# Patient Record
Sex: Female | Born: 1985 | Race: Black or African American | Hispanic: No | Marital: Single | Smoking: Never smoker
Health system: Southern US, Community
[De-identification: ages and names within clinical notes are randomized; demographics above are authoritative.]

## PROBLEM LIST (undated history)

## (undated) DIAGNOSIS — J45909 Unspecified asthma, uncomplicated: Secondary | ICD-10-CM

## (undated) HISTORY — PX: TUBAL LIGATION: SHX77

---

## 2008-05-28 ENCOUNTER — Emergency Department: Payer: Self-pay | Admitting: Emergency Medicine

## 2009-04-03 ENCOUNTER — Emergency Department: Payer: Self-pay | Admitting: Emergency Medicine

## 2010-11-08 ENCOUNTER — Emergency Department: Payer: Self-pay | Admitting: Emergency Medicine

## 2011-11-02 ENCOUNTER — Emergency Department: Payer: Self-pay | Admitting: Emergency Medicine

## 2011-11-02 LAB — PREGNANCY, URINE: Pregnancy Test, Urine: NEGATIVE m[IU]/mL

## 2011-11-02 LAB — URINALYSIS, COMPLETE
Glucose,UR: NEGATIVE mg/dL (ref 0–75)
Ketone: NEGATIVE
Leukocyte Esterase: NEGATIVE
Nitrite: NEGATIVE
Protein: NEGATIVE
RBC,UR: 1 /HPF (ref 0–5)
Specific Gravity: 1.021 (ref 1.003–1.030)
WBC UR: 2 /HPF (ref 0–5)

## 2011-11-02 LAB — CBC WITH DIFFERENTIAL/PLATELET
Basophil #: 0 10*3/uL (ref 0.0–0.1)
Eosinophil #: 0.3 10*3/uL (ref 0.0–0.7)
HCT: 33.5 % — ABNORMAL LOW (ref 35.0–47.0)
HGB: 10.7 g/dL — ABNORMAL LOW (ref 12.0–16.0)
Lymphocyte #: 2.1 10*3/uL (ref 1.0–3.6)
MCHC: 31.9 g/dL — ABNORMAL LOW (ref 32.0–36.0)
Monocyte #: 0.5 x10 3/mm (ref 0.2–0.9)
Platelet: 237 10*3/uL (ref 150–440)
RDW: 15.1 % — ABNORMAL HIGH (ref 11.5–14.5)

## 2011-11-02 LAB — COMPREHENSIVE METABOLIC PANEL
Anion Gap: 8 (ref 7–16)
BUN: 13 mg/dL (ref 7–18)
Bilirubin,Total: 0.3 mg/dL (ref 0.2–1.0)
Chloride: 102 mmol/L (ref 98–107)
Co2: 28 mmol/L (ref 21–32)
Creatinine: 0.66 mg/dL (ref 0.60–1.30)
EGFR (African American): >60
EGFR (Non-African Amer.): >60
Potassium: 4.1 mmol/L (ref 3.5–5.1)
SGOT(AST): 9 U/L — ABNORMAL LOW (ref 15–37)
Total Protein: 8.3 g/dL — ABNORMAL HIGH (ref 6.4–8.2)

## 2012-08-24 ENCOUNTER — Emergency Department: Payer: Self-pay | Admitting: Emergency Medicine

## 2012-08-24 LAB — URINALYSIS, COMPLETE
Glucose,UR: NEGATIVE mg/dL (ref 0–75)
Ph: 6 (ref 4.5–8.0)
RBC,UR: 1 /HPF (ref 0–5)
Specific Gravity: 1.017 (ref 1.003–1.030)
Squamous Epithelial: 4
WBC UR: 3 /HPF (ref 0–5)

## 2012-08-24 LAB — COMPREHENSIVE METABOLIC PANEL
Albumin: 3.1 g/dL — ABNORMAL LOW (ref 3.4–5.0)
Anion Gap: 8 (ref 7–16)
Calcium, Total: 8.9 mg/dL (ref 8.5–10.1)
Chloride: 102 mmol/L (ref 98–107)
Co2: 26 mmol/L (ref 21–32)
Creatinine: 0.74 mg/dL (ref 0.60–1.30)
EGFR (African American): >60
Potassium: 3.9 mmol/L (ref 3.5–5.1)
SGOT(AST): 14 U/L — ABNORMAL LOW (ref 15–37)
SGPT (ALT): 15 U/L (ref 12–78)
Sodium: 136 mmol/L (ref 136–145)

## 2012-08-24 LAB — LIPASE, BLOOD: Lipase: 82 U/L (ref 73–393)

## 2012-08-24 LAB — CBC
HCT: 31.5 % — ABNORMAL LOW (ref 35.0–47.0)
HGB: 9.8 g/dL — ABNORMAL LOW (ref 12.0–16.0)
MCHC: 31.2 g/dL — ABNORMAL LOW (ref 32.0–36.0)
MCV: 75 fL — ABNORMAL LOW (ref 80–100)
Platelet: 197 10*3/uL (ref 150–440)
RDW: 17.6 % — ABNORMAL HIGH (ref 11.5–14.5)
WBC: 7.9 10*3/uL (ref 3.6–11.0)

## 2012-08-24 LAB — WET PREP, GENITAL

## 2012-08-25 LAB — GC/CHLAMYDIA PROBE AMP

## 2015-03-24 ENCOUNTER — Emergency Department
Admission: EM | Admit: 2015-03-24 | Discharge: 2015-03-24 | Disposition: A | Discharge status: Home / Self Care | Payer: Self-pay | Attending: Emergency Medicine | Admitting: Emergency Medicine

## 2015-03-24 ENCOUNTER — Encounter: Payer: Self-pay | Admitting: Emergency Medicine

## 2015-03-24 ENCOUNTER — Emergency Department: Payer: Self-pay

## 2015-03-24 DIAGNOSIS — Z3202 Encounter for pregnancy test, result negative: Secondary | ICD-10-CM | POA: Insufficient documentation

## 2015-03-24 DIAGNOSIS — N390 Urinary tract infection, site not specified: Secondary | ICD-10-CM | POA: Insufficient documentation

## 2015-03-24 DIAGNOSIS — R112 Nausea with vomiting, unspecified: Secondary | ICD-10-CM | POA: Insufficient documentation

## 2015-03-24 HISTORY — DX: Unspecified asthma, uncomplicated: J45.909

## 2015-03-24 LAB — COMPREHENSIVE METABOLIC PANEL
ALT: 12 U/L — ABNORMAL LOW (ref 14–54)
AST: 16 U/L (ref 15–41)
Albumin: 3.9 g/dL (ref 3.5–5.0)
Alkaline Phosphatase: 56 U/L (ref 38–126)
Anion gap: 8 (ref 5–15)
BILIRUBIN TOTAL: 0.6 mg/dL (ref 0.3–1.2)
BUN: 10 mg/dL (ref 6–20)
CHLORIDE: 102 mmol/L (ref 101–111)
CO2: 28 mmol/L (ref 22–32)
Calcium: 9.1 mg/dL (ref 8.9–10.3)
Creatinine, Ser: 0.74 mg/dL (ref 0.44–1.00)
GFR calc non Af Amer: >60 mL/min (ref >60)
Glucose, Bld: 109 mg/dL — ABNORMAL HIGH (ref 65–99)
POTASSIUM: 4.3 mmol/L (ref 3.5–5.1)
Sodium: 138 mmol/L (ref 135–145)
TOTAL PROTEIN: 8.3 g/dL — AB (ref 6.5–8.1)

## 2015-03-24 LAB — URINALYSIS COMPLETE WITH MICROSCOPIC (ARMC ONLY)
BACTERIA UA: NONE SEEN
BILIRUBIN URINE: NEGATIVE
Glucose, UA: NEGATIVE mg/dL
KETONES UR: NEGATIVE mg/dL
NITRITE: NEGATIVE
PH: 5 (ref 5.0–8.0)
PROTEIN: NEGATIVE mg/dL
Specific Gravity, Urine: 1.016 (ref 1.005–1.030)

## 2015-03-24 LAB — CBC
HEMATOCRIT: 35.9 % (ref 35.0–47.0)
Hemoglobin: 11.4 g/dL — ABNORMAL LOW (ref 12.0–16.0)
MCH: 24.8 pg — ABNORMAL LOW (ref 26.0–34.0)
MCHC: 31.8 g/dL — ABNORMAL LOW (ref 32.0–36.0)
MCV: 77.9 fL — AB (ref 80.0–100.0)
PLATELETS: 205 10*3/uL (ref 150–440)
RBC: 4.61 MIL/uL (ref 3.80–5.20)
RDW: 16.6 % — AB (ref 11.5–14.5)
WBC: 8.1 10*3/uL (ref 3.6–11.0)

## 2015-03-24 LAB — PREGNANCY, URINE: Preg Test, Ur: NEGATIVE

## 2015-03-24 LAB — LIPASE, BLOOD: LIPASE: 22 U/L (ref 11–51)

## 2015-03-24 MED ORDER — MORPHINE SULFATE (PF) 4 MG/ML IV SOLN
4.0000 mg | Freq: Once | INTRAVENOUS | Status: AC
  Administered 2015-03-24: 4 mg via INTRAVENOUS
  Filled 2015-03-24: qty 1

## 2015-03-24 MED ORDER — IOHEXOL 240 MG/ML SOLN
25.0000 mL | Freq: Once | INTRAMUSCULAR | Status: AC | PRN
  Administered 2015-03-24: 25 mL via ORAL

## 2015-03-24 MED ORDER — SODIUM CHLORIDE 0.9 % IV BOLUS (SEPSIS)
1000.0000 mL | Freq: Once | INTRAVENOUS | Status: AC
  Administered 2015-03-24: 1000 mL via INTRAVENOUS

## 2015-03-24 MED ORDER — ONDANSETRON 4 MG PO TBDP: 4.0000 mg | ORAL_TABLET | Freq: Four times a day (QID) | ORAL | Status: DC | PRN

## 2015-03-24 MED ORDER — CEPHALEXIN 500 MG PO CAPS: 500.0000 mg | ORAL_CAPSULE | Freq: Four times a day (QID) | ORAL | Status: DC

## 2015-03-24 MED ORDER — ONDANSETRON HCL 4 MG/2ML IJ SOLN
4.0000 mg | Freq: Once | INTRAMUSCULAR | Status: AC
  Administered 2015-03-24: 4 mg via INTRAVENOUS
  Filled 2015-03-24: qty 2

## 2015-03-24 MED ORDER — IOHEXOL 350 MG/ML SOLN
115.0000 mL | Freq: Once | INTRAVENOUS | Status: AC | PRN
  Administered 2015-03-24: 115 mL via INTRAVENOUS

## 2015-03-24 NOTE — ED Provider Notes (Signed)
[facility] Emergency Department Provider Note  ____________________________________________  Time seen: Approximately 9:38 AM  I have reviewed the triage vital signs and the nursing notes.   HISTORY  Chief Complaint Abdominal Pain    HPI Deborah Chambers is a 30 y.o. female presents for evaluation of nausea and vomiting for about the last 3 days. Patient reports on Wednesday she started to feel some nausea and generalized fatigue. Then yesterday she felt nauseous throughout the workday, began having loose brown stool couple of times. Since last evening she has developed a crampy discomfort feels like her "bowels" especially when she sees bathroom. She has had to use the bathroom 2-3 times during the evening to defecate. She denies dysuria reports intermittent but somewhat severe crampy abdominal pain off-and-on throughout this morning. She did vomit once yesterday. No black or bloody vomit. Denies pregnancy, previous tubal ligation. No vaginal discharge, bleeding, or "pelvic" pain.   Past Medical History  Diagnosis Date  . Asthma     There are no active problems to display for this patient.   Past Surgical History  Procedure Laterality Date  . Tubal ligation      Current Outpatient Rx  Name  Route  Sig  Dispense  Refill  . cephALEXin (KEFLEX) 500 MG capsule   Oral   Take 1 capsule (500 mg total) by mouth 4 (four) times daily.   40 capsule   0   . ondansetron (ZOFRAN ODT) 4 MG disintegrating tablet   Oral   Take 1 tablet (4 mg total) by mouth every 6 (six) hours as needed for nausea or vomiting.   20 tablet   0     Allergies Review of patient's allergies indicates no known allergies.  History reviewed. No pertinent family history.  Social History Social History  Substance Use Topics  . Smoking status: Never Smoker   . Smokeless tobacco: None  . Alcohol Use: None    Review of Systems Constitutional: No fever/chills Eyes: No visual  changes. ENT: No sore throat. Cardiovascular: Denies chest pain. Respiratory: Denies shortness of breath. Gastrointestinal: No constipation. Genitourinary: Negative for dysuria. Musculoskeletal: Negative for back pain. Skin: Negative for rash. Neurological: Negative for headaches, focal weakness or numbness.  10-point ROS otherwise negative.  ____________________________________________   PHYSICAL EXAM:  VITAL SIGNS: ED Triage Vitals  Enc Vitals Group     BP 03/24/15 0746 112/53 mmHg     Pulse Rate 03/24/15 0746 91     Resp 03/24/15 0746 20     Temp 03/24/15 0746 97.5 F (36.4 C)     Temp Source 03/24/15 0746 Oral     SpO2 03/24/15 0746 97 %     Weight 03/24/15 0746 380 lb (172.367 kg)     Height 03/24/15 0746 5\' 2"  (1.575 m)     Head Cir --      Peak Flow --      Pain Score 03/24/15 0743 10     Pain Loc --      Pain Edu? --      Excl. in GC? --    Constitutional: Alert and oriented. Well appearing and in no acute distress. Eyes: Conjunctivae are normal. PERRL. EOMI. Head: Atraumatic. Nose: No congestion/rhinnorhea. Mouth/Throat: Mucous membranes are moist.  Oropharynx non-erythematous. Neck: No stridor.   Cardiovascular: Normal rate, regular rhythm. Grossly normal heart sounds.  Good peripheral circulation. Respiratory: Normal respiratory effort.  No retractions. Lungs CTAB. Gastrointestinal: Soft and nontender except for some mild discomfort along the  left lower quadrant without rebound or guarding. No distention. No abdominal bruits. No CVA tenderness. Musculoskeletal: No lower extremity tenderness nor edema.  No joint effusions. Neurologic:  Normal speech and language. No gross focal neurologic deficits are appreciated. Skin:  Skin is warm, dry and intact. No rash noted. Psychiatric: Mood and affect are normal. Speech and behavior are normal.  ____________________________________________   LABS (all labs ordered are listed, but only abnormal results are  displayed)  Labs Reviewed  COMPREHENSIVE METABOLIC PANEL - Abnormal; Notable for the following:    Glucose, Bld 109 (*)    Total Protein 8.3 (*)    ALT 12 (*)    All other components within normal limits  CBC - Abnormal; Notable for the following:    Hemoglobin 11.4 (*)    MCV 77.9 (*)    MCH 24.8 (*)    MCHC 31.8 (*)    RDW 16.6 (*)    All other components within normal limits  URINALYSIS COMPLETEWITH MICROSCOPIC ([facility] ONLY) - Abnormal; Notable for the following:    Color, Urine YELLOW (*)    APPearance HAZY (*)    Hgb urine dipstick 2+ (*)    Leukocytes, UA 1+ (*)    Squamous Epithelial / LPF 6-30 (*)    All other components within normal limits  LIPASE, BLOOD  PREGNANCY, URINE   ____________________________________________  EKG   ____________________________________________  RADIOLOGY   IMPRESSION: 1. Multiple gallstones fill the gallbladder and measure up to 1.8 cm in diameter. No definite pericholecystic fluid or biliary dilatation observed. 2. 8 mm right kidney upper pole fatty density of possibly from scarring or a small angiomyolipoma. 3. Somewhat elongated uterus, 14.1 cm in length, but without a focal abnormality observed. 4. Small umbilical hernia contains adipose tissue. 5. No specific abnormality of the bowel is identified. Appendix normal.   Electronically Signed By: Gaylyn Rong M.D. On: 03/24/2015 11:49       ____________________________________________   PROCEDURES  Procedure(s) performed: None  Critical Care performed: No  ____________________________________________   INITIAL IMPRESSION / ASSESSMENT AND PLAN / ED COURSE  Pertinent labs & imaging results that were available during my care of the patient were reviewed by me and considered in my medical decision making (see chart for details).  Patient transfer evaluation of nausea, crampy abdominal pain with associated 1 episode of vomiting and a few loose stools.  She denies any travel, recent antibiotics, or hospitalization.  Does not seem to have any significant risk factors for Clostridium difficile or infectious colitis. She denies any gynecologic symptoms.  The patient's body habitus does seem to prohibit close examination for peritonitis. Discussed risks and benefits of CT scan with the patient, and after discussion we will obtain CT to further evaluate for signs or symptoms to explain her abdominal discomfort such as diverticulitis, colitis, appendicitis etc. Overall nontoxic with reassuring exam and laboratory analysis.  ----------------------------------------- 2:12 PM on 03/24/2015 -----------------------------------------  Patient reports feeling well. No further nausea or emesis. The abdominal exam reassuring soft nontender nondistended. Discussed possible urinary tract infection and we'll treat. Careful abdominal pain return precautions discussed with patient who is quite agreeable.  Patient is aware that she has gallstones, currently not having any pain in the upper abdomen back or right upper quadrant.Doubt this is acute cholecystitis. Afebrile, normal white count, normal transaminases. No pain in the right upper abdomen and no secondary evidence of cholecystitis by CT.  Return precautions and treatment recommendations and follow-up discussed with the patient who is agreeable  with the plan.  Patient appears much improved. No further emesis diarrhea or concerns this time. ____________________________________________   FINAL CLINICAL IMPRESSION(S) / ED DIAGNOSES  Final diagnoses:  Acute urinary tract infection  Non-intractable vomiting with nausea, vomiting of unspecified type      Sharyn Creamer, MD 03/24/15 1413

## 2015-03-24 NOTE — ED Notes (Signed)
Reports abd pain last pm, n/v/d.

## 2015-03-24 NOTE — Discharge Instructions (Signed)
You were seen in the emergency room for abdominal pain. It is important that you follow up closely with your primary care doctor in the next couple of days.  If you're unable to see her primary care doctor you may return to the emergency room or go to the [facility] walk-in clinic in 1 or 2 days for reexam.  Please return to the emergency room right away if you are to develop a fever, severe nausea, your pain becomes severe or worsens, you are unable to keep food down, begin vomiting any dark or bloody fluid, you develop any dark or bloody stools, feel dehydrated, or other new concerns or symptoms arise.   Urinary Tract Infection Urinary tract infections (UTIs) can develop anywhere along your urinary tract. Your urinary tract is your body's drainage system for removing wastes and extra water. Your urinary tract includes two kidneys, two ureters, a bladder, and a urethra. Your kidneys are a pair of bean-shaped organs. Each kidney is about the size of your fist. They are located below your ribs, one on each side of your spine. CAUSES Infections are caused by microbes, which are microscopic organisms, including fungi, viruses, and bacteria. These organisms are so small that they can only be seen through a microscope. Bacteria are the microbes that most commonly cause UTIs. SYMPTOMS  Symptoms of UTIs may vary by age and gender of the patient and by the location of the infection. Symptoms in young women typically include a frequent and intense urge to urinate and a painful, burning feeling in the bladder or urethra during urination. Older women and men are more likely to be tired, shaky, and weak and have muscle aches and abdominal pain. A fever may mean the infection is in your kidneys. Other symptoms of a kidney infection include pain in your back or sides below the ribs, nausea, and vomiting. DIAGNOSIS To diagnose a UTI, your caregiver will ask you about your symptoms. Your caregiver will also ask you to  provide a urine sample. The urine sample will be tested for bacteria and white blood cells. White blood cells are made by your body to help fight infection. TREATMENT  Typically, UTIs can be treated with medication. Because most UTIs are caused by a bacterial infection, they usually can be treated with the use of antibiotics. The choice of antibiotic and length of treatment depend on your symptoms and the type of bacteria causing your infection. HOME CARE INSTRUCTIONS  If you were prescribed antibiotics, take them exactly as your caregiver instructs you. Finish the medication even if you feel better after you have only taken some of the medication.  Drink enough water and fluids to keep your urine clear or pale yellow.  Avoid caffeine, tea, and carbonated beverages. They tend to irritate your bladder.  Empty your bladder often. Avoid holding urine for long periods of time.  Empty your bladder before and after sexual intercourse.  After a bowel movement, women should cleanse from front to back. Use each tissue only once. SEEK MEDICAL CARE IF:   You have back pain.  You develop a fever.  Your symptoms do not begin to resolve within 3 days. SEEK IMMEDIATE MEDICAL CARE IF:   You have severe back pain or lower abdominal pain.  You develop chills.  You have nausea or vomiting.  You have continued burning or discomfort with urination. MAKE SURE YOU:   Understand these instructions.  Will watch your condition.  Will get help right away if you are  not doing well or get worse.   This information is not intended to replace advice given to you by your health care provider. Make sure you discuss any questions you have with your health care provider.   Document Released: 11/07/2004 Document Revised: 10/19/2014 Document Reviewed: 03/08/2011 Elsevier Interactive Patient Education [employer]

## 2016-05-26 DIAGNOSIS — A64 Unspecified sexually transmitted disease: Secondary | ICD-10-CM | POA: Insufficient documentation

## 2016-05-26 DIAGNOSIS — A749 Chlamydial infection, unspecified: Secondary | ICD-10-CM | POA: Insufficient documentation

## 2016-05-26 DIAGNOSIS — J45909 Unspecified asthma, uncomplicated: Secondary | ICD-10-CM | POA: Diagnosis not present

## 2016-05-26 DIAGNOSIS — R102 Pelvic and perineal pain: Secondary | ICD-10-CM | POA: Diagnosis present

## 2016-05-27 ENCOUNTER — Emergency Department: Payer: Medicaid Other

## 2016-05-27 ENCOUNTER — Encounter: Payer: Self-pay | Admitting: Emergency Medicine

## 2016-05-27 ENCOUNTER — Emergency Department
Admission: EM | Admit: 2016-05-27 | Discharge: 2016-05-27 | Disposition: A | Discharge status: Home / Self Care | Payer: Medicaid Other | Attending: Emergency Medicine | Admitting: Emergency Medicine

## 2016-05-27 DIAGNOSIS — R102 Pelvic and perineal pain: Secondary | ICD-10-CM

## 2016-05-27 DIAGNOSIS — A749 Chlamydial infection, unspecified: Secondary | ICD-10-CM

## 2016-05-27 DIAGNOSIS — A64 Unspecified sexually transmitted disease: Secondary | ICD-10-CM

## 2016-05-27 DIAGNOSIS — R52 Pain, unspecified: Secondary | ICD-10-CM

## 2016-05-27 LAB — URINALYSIS, ROUTINE W REFLEX MICROSCOPIC
Bilirubin Urine: NEGATIVE
GLUCOSE, UA: NEGATIVE mg/dL
KETONES UR: NEGATIVE mg/dL
LEUKOCYTES UA: NEGATIVE
NITRITE: NEGATIVE
PH: 5 (ref 5.0–8.0)
PROTEIN: NEGATIVE mg/dL
Specific Gravity, Urine: 1.015 (ref 1.005–1.030)

## 2016-05-27 LAB — WET PREP, GENITAL
CLUE CELLS WET PREP: NONE SEEN
Sperm: NONE SEEN
TRICH WET PREP: NONE SEEN
Yeast Wet Prep HPF POC: NONE SEEN

## 2016-05-27 LAB — CHLAMYDIA/NGC RT PCR (ARMC ONLY)
CHLAMYDIA TR: DETECTED — AB
N GONORRHOEAE: NOT DETECTED

## 2016-05-27 LAB — POCT PREGNANCY, URINE: Preg Test, Ur: NEGATIVE

## 2016-05-27 MED ORDER — HYDROCODONE-ACETAMINOPHEN 5-325 MG PO TABS
1.0000 | ORAL_TABLET | Freq: Once | ORAL | Status: AC
  Administered 2016-05-27: 1 via ORAL

## 2016-05-27 MED ORDER — ONDANSETRON 4 MG PO TBDP
4.0000 mg | ORAL_TABLET | Freq: Once | ORAL | Status: AC
  Administered 2016-05-27: 4 mg via ORAL
  Filled 2016-05-27: qty 1

## 2016-05-27 MED ORDER — HYDROCODONE-ACETAMINOPHEN 5-325 MG PO TABS
ORAL_TABLET | ORAL | Status: AC
  Administered 2016-05-27: 1 via ORAL
  Filled 2016-05-27: qty 1

## 2016-05-27 MED ORDER — KETOROLAC TROMETHAMINE 30 MG/ML IJ SOLN
60.0000 mg | Freq: Once | INTRAMUSCULAR | Status: AC
  Administered 2016-05-27: 60 mg via INTRAMUSCULAR
  Filled 2016-05-27: qty 2

## 2016-05-27 MED ORDER — AZITHROMYCIN 1 G PO PACK
1.0000 g | PACK | Freq: Once | ORAL | Status: AC
  Administered 2016-05-27: 1 g via ORAL
  Filled 2016-05-27: qty 1

## 2016-05-27 NOTE — Discharge Instructions (Signed)
You have been treated with antibiotics for Chlamydia. Please inform your sexual partner to seek treatment at the health Department. Follow up with the gynecologist for vaginal cyst seen on ultrasound. Return to the ER for worsening symptoms, persistent vomiting, difficulty breathing, fevers or other concerns.

## 2016-05-27 NOTE — ED Triage Notes (Signed)
Pt states that she started having lower abd cramping and pain with urination yesterday some diarrhea, no vomiting, denies any vaginal discharge, states reminds her of a uti

## 2016-05-27 NOTE — ED Notes (Signed)
Pt. Going home with family. 

## 2016-05-27 NOTE — ED Notes (Signed)
Pt c/o urinary frequency since Friday; denies dysuria; denies fever; c/o pain across lower abd; rates pain 10/10

## 2016-05-27 NOTE — ED Provider Notes (Signed)
[facility] Emergency Department Provider Note   ____________________________________________   First MD Initiated Contact with Patient 05/27/16 [phone]     (approximate)  I have reviewed the triage vital signs and the nursing notes.   HISTORY  Chief Complaint Dysuria    HPI Deborah Chambers is a 31 y.o. female who presents to the ED from home with a chief complaint of pelvic pain. Patient reports onset of pelvic pain 3 days ago. Complains of cramping and sharp pain across her lower abdomen and vagina. Last sexual intercourse, unprotected, yesterday which was painful. Denies vaginal discharge. Denies dysuria but states she feels pressure when she urinates. Denies fever, chills, chest pain, shortness of breath, nausea, vomiting, diarrhea. Denies recent travel or trauma. Nothing makes her symptoms better or worse.   Past Medical History:  Diagnosis Date  . Asthma     There are no active problems to display for this patient.   Past Surgical History:  Procedure Laterality Date  . TUBAL LIGATION      Prior to Admission medications   Not on File    Allergies Patient has no known allergies.  History reviewed. No pertinent family history.  Social History Social History  Substance Use Topics  . Smoking status: Never Smoker  . Smokeless tobacco: Never Used  . Alcohol use Yes    Review of Systems  Constitutional: No fever/chills. Eyes: No visual changes. ENT: No sore throat. Cardiovascular: Denies chest pain. Respiratory: Denies shortness of breath. Gastrointestinal: Positive for pelvic pain. No abdominal pain.  No nausea, no vomiting.  No diarrhea.  No constipation. Genitourinary: Positive for urinary pressure. Negative for dysuria. Musculoskeletal: Negative for back pain. Skin: Negative for rash. Neurological: Negative for headaches, focal weakness or numbness.  10-point ROS otherwise  negative.  ____________________________________________   PHYSICAL EXAM:  VITAL SIGNS: ED Triage Vitals  Enc Vitals Group     BP 05/27/16 0007 (!) 145/83     Pulse Rate 05/27/16 0007 92     Resp 05/27/16 0007 18     Temp 05/27/16 0007 98.3 F (36.8 C)     Temp Source 05/27/16 0007 Oral     SpO2 05/27/16 0007 99 %     Weight 05/27/16 0009 (!) 409 lb (185.5 kg)     Height 05/27/16 0009  (1.626 m)     Head Circumference --      Peak Flow --      Pain Score 05/27/16 0007 10     Pain Loc --      Pain Edu? --      Excl. in GC? --     Constitutional: Alert and oriented. Well appearing and in no acute distress. Eyes: Conjunctivae are normal. PERRL. EOMI. Head: Atraumatic. Nose: No congestion/rhinnorhea. Mouth/Throat: Mucous membranes are moist.  Oropharynx non-erythematous. Neck: No stridor.   Cardiovascular: Normal rate, regular rhythm. Grossly normal heart sounds.  Good peripheral circulation. Respiratory: Normal respiratory effort.  No retractions. Lungs CTAB. Gastrointestinal: Obese. Soft and mildly tender to palpation midline pelvis without rebound or guarding. No distention. No abdominal bruits. No CVA tenderness. Musculoskeletal: No lower extremity tenderness nor edema.  No joint effusions. Neurologic:  Normal speech and language. No gross focal neurologic deficits are appreciated. No gait instability. Skin:  Skin is warm, dry and intact. No rash noted. Psychiatric: Mood and affect are normal. Speech and behavior are normal.  ____________________________________________   LABS (all labs ordered are listed, but only abnormal results are displayed)  Labs  Reviewed  CHLAMYDIA/NGC RT PCR ([facility] ONLY) - Abnormal; Notable for the following:       Result Value   Chlamydia Tr DETECTED (*)    All other components within normal limits  WET PREP, GENITAL - Abnormal; Notable for the following:    WBC, Wet Prep HPF POC FEW (*)    All other components within normal  limits  URINALYSIS, ROUTINE W REFLEX MICROSCOPIC - Abnormal; Notable for the following:    Color, Urine YELLOW (*)    APPearance HAZY (*)    Hgb urine dipstick SMALL (*)    Bacteria, UA RARE (*)    Squamous Epithelial / LPF 6-30 (*)    All other components within normal limits  POC URINE PREG, ED  POCT PREGNANCY, URINE   ____________________________________________  EKG  None ____________________________________________  RADIOLOGY  Pelvic ultrasound interpreted per Dr. Andria Meuse: Somewhat limited visualization but uterus and ovaries appear normal.  No evidence of ovarian mass or torsion. Incidental note of a complex  cystic structure in the vaginal canal of nonspecific etiology.   ____________________________________________   PROCEDURES  Procedure(s) performed:   Pelvic exam: External exam WNL without lesions, rashes or vesicles. Speculum exam reveals white discharge. Bimanual exam WNL.  Procedures  Critical Care performed: No  ____________________________________________   INITIAL IMPRESSION / ASSESSMENT AND PLAN / ED COURSE  Pertinent labs & imaging results that were available during my care of the patient were reviewed by me and considered in my medical decision making (see chart for details).  31 year old female who presents with pelvic pain. Urinalysis is unremarkable. Await results of wet prep, DNA in will proceed with pelvic ultrasound.   Clinical Course as of May 28 639  Mon May 27, 2016  0603 Updated patient on imaging and Chlamydia results. Will treat with azithromycin pack it in the emergency department. Encouraged patient to inform her sexual partner to seek treatment at the health Department. She will follow up with GYN for vaginal cyst. Strict return precautions given. Patient verbalizes understanding and agrees with plan of care.  [JS]    Clinical Course User Index [JS] Irean Hong, MD     ____________________________________________   FINAL  CLINICAL IMPRESSION(S) / ED DIAGNOSES  Final diagnoses:  Pain  Pelvic pain  STD (female)  Chlamydia      NEW MEDICATIONS STARTED DURING THIS VISIT:  New Prescriptions   No medications on file     Note:  This document was prepared using Dragon voice recognition software and may include unintentional dictation errors.    Irean Hong, MD 05/27/16 [phone]

## 2016-05-27 NOTE — ED Notes (Signed)
Pt ambulatory with steady gait to treatment room; 2 minor children with pt

## 2016-09-07 ENCOUNTER — Emergency Department
Admission: EM | Admit: 2016-09-07 | Discharge: 2016-09-07 | Disposition: A | Discharge status: Home / Self Care | Payer: Medicaid Other | Attending: Emergency Medicine | Admitting: Emergency Medicine

## 2016-09-07 ENCOUNTER — Encounter: Payer: Self-pay | Admitting: Emergency Medicine

## 2016-09-07 DIAGNOSIS — N760 Acute vaginitis: Secondary | ICD-10-CM | POA: Insufficient documentation

## 2016-09-07 DIAGNOSIS — R300 Dysuria: Secondary | ICD-10-CM | POA: Diagnosis present

## 2016-09-07 DIAGNOSIS — J45909 Unspecified asthma, uncomplicated: Secondary | ICD-10-CM | POA: Insufficient documentation

## 2016-09-07 DIAGNOSIS — B9689 Other specified bacterial agents as the cause of diseases classified elsewhere: Secondary | ICD-10-CM

## 2016-09-07 LAB — URINALYSIS, COMPLETE (UACMP) WITH MICROSCOPIC
BILIRUBIN URINE: NEGATIVE
Glucose, UA: NEGATIVE mg/dL
KETONES UR: NEGATIVE mg/dL
NITRITE: NEGATIVE
PROTEIN: NEGATIVE mg/dL
Specific Gravity, Urine: 1.023 (ref 1.005–1.030)
pH: 5 (ref 5.0–8.0)

## 2016-09-07 LAB — BASIC METABOLIC PANEL
ANION GAP: 6 (ref 5–15)
BUN: 11 mg/dL (ref 6–20)
CHLORIDE: 105 mmol/L (ref 101–111)
CO2: 26 mmol/L (ref 22–32)
Calcium: 8.9 mg/dL (ref 8.9–10.3)
Creatinine, Ser: 0.76 mg/dL (ref 0.44–1.00)
Glucose, Bld: 98 mg/dL (ref 65–99)
POTASSIUM: 4 mmol/L (ref 3.5–5.1)
SODIUM: 137 mmol/L (ref 135–145)

## 2016-09-07 LAB — WET PREP, GENITAL
SPERM: NONE SEEN
Trich, Wet Prep: NONE SEEN
Yeast Wet Prep HPF POC: NONE SEEN

## 2016-09-07 LAB — CBC
HCT: 34 % — ABNORMAL LOW (ref 35.0–47.0)
HEMOGLOBIN: 11.1 g/dL — AB (ref 12.0–16.0)
MCH: 25.3 pg — ABNORMAL LOW (ref 26.0–34.0)
MCHC: 32.6 g/dL (ref 32.0–36.0)
MCV: 77.5 fL — ABNORMAL LOW (ref 80.0–100.0)
PLATELETS: 197 10*3/uL (ref 150–440)
RBC: 4.38 MIL/uL (ref 3.80–5.20)
RDW: 16.9 % — ABNORMAL HIGH (ref 11.5–14.5)
WBC: 8.2 10*3/uL (ref 3.6–11.0)

## 2016-09-07 LAB — CHLAMYDIA/NGC RT PCR (ARMC ONLY)
Chlamydia Tr: NOT DETECTED
N gonorrhoeae: NOT DETECTED

## 2016-09-07 LAB — PREGNANCY, URINE: PREG TEST UR: NEGATIVE

## 2016-09-07 MED ORDER — METRONIDAZOLE 500 MG PO TABS
500.0000 mg | ORAL_TABLET | Freq: Once | ORAL | Status: AC
  Administered 2016-09-07: 500 mg via ORAL
  Filled 2016-09-07: qty 1

## 2016-09-07 MED ORDER — IBUPROFEN 800 MG PO TABS
800.0000 mg | ORAL_TABLET | Freq: Once | ORAL | Status: AC
  Administered 2016-09-07: 800 mg via ORAL
  Filled 2016-09-07: qty 1

## 2016-09-07 MED ORDER — METRONIDAZOLE 500 MG PO TABS: 500.0000 mg | ORAL_TABLET | Freq: Two times a day (BID) | ORAL | 0 refills | Status: AC

## 2016-09-07 NOTE — Discharge Instructions (Signed)
Please take medications as prescribed. Avoid sexual intercourse until after you have finished her antibiotics and symptoms have resolved. Return to the ER for any fevers, increasing pain, worsening symptoms or urgent changes in her health. Please take ibuprofen as needed for pain.

## 2016-09-07 NOTE — ED Notes (Signed)
Pt signed e signature.  D/c inst to pt.   Pt alert.   

## 2016-09-07 NOTE — ED Provider Notes (Signed)
[facility]-EMERGENCY DEPARTMENT Provider Note   CSN: [id number] Arrival date & time: 09/07/16  1719     History   Chief Complaint Chief Complaint  Patient presents with  . Dysuria    HPI Deborah Chambers is a 31 y.o. female presents to the emergency department for evaluation of pelvic pain, urinary frequency and discharge. Symptoms have been present for several days. No trauma or injury. She denies any vaginal bleeding. Was treated for STD several months ago. Denies any fevers, back pain, nausea or vomiting. Tolerating by mouth well. No new sexual partners. No rashes.  HPI  Past Medical History:  Diagnosis Date  . Asthma     There are no active problems to display for this patient.   Past Surgical History:  Procedure Laterality Date  . TUBAL LIGATION      OB History    No data available       Home Medications    Prior to Admission medications   Medication Sig Start Date End Date Taking? Authorizing Provider  metroNIDAZOLE (FLAGYL) 500 MG tablet Take 1 tablet (500 mg total) by mouth 2 (two) times daily. 09/07/16 09/14/16  Evon SlackGaines, Aaliyah Cancro C, PA-C    Family History No family history on file.  Social History Social History  Substance Use Topics  . Smoking status: Never Smoker  . Smokeless tobacco: Never Used  . Alcohol use Yes     Allergies   Patient has no known allergies.   Review of Systems Review of Systems  Constitutional: Negative for activity change, chills, fatigue and fever.  HENT: Negative for congestion, rhinorrhea, sinus pressure, sore throat and trouble swallowing.   Eyes: Negative for visual disturbance.  Respiratory: Negative for cough, chest tightness, shortness of breath and wheezing.   Cardiovascular: Negative for chest pain and leg swelling.  Gastrointestinal: Negative for abdominal pain, diarrhea, nausea and vomiting.  Genitourinary: Positive for urgency and vaginal discharge. Negative for dysuria and flank pain.  Musculoskeletal: Negative for  arthralgias, back pain and gait problem.  Skin: Negative for rash.  Neurological: Negative for weakness, numbness and headaches.  Hematological: Negative for adenopathy.  Psychiatric/Behavioral: Negative for agitation, behavioral problems and confusion.  All other systems reviewed and are negative.    Physical Exam Updated Vital Signs BP 138/90   Pulse 90   Temp 98.1 F (36.7 C) (Oral)   Resp 20   Ht 5\' 2"  (1.575 m)   Wt (!) 181.4 kg (400 lb)   LMP 08/24/2016   SpO2 97%   BMI 73.16 kg/m   Physical Exam  Constitutional: She appears well-developed and well-nourished. No distress.  HENT:  Head: Normocephalic and atraumatic.  Right Ear: External ear normal.  Left Ear: External ear normal.  Mouth/Throat: Oropharynx is clear and moist.  Eyes: Conjunctivae are normal.  Neck: Normal range of motion. Neck supple.  Cardiovascular: Normal rate, regular rhythm and normal heart sounds.   No murmur heard. Pulmonary/Chest: Effort normal and breath sounds normal. No respiratory distress.  Abdominal: Soft. There is no tenderness.  Genitourinary: Cervix exhibits no motion tenderness and no discharge. Right adnexum displays no tenderness. Left adnexum displays no tenderness. No tenderness or bleeding in the vagina. Vaginal discharge found.  Musculoskeletal: She exhibits no edema.  Neurological: She is alert.  Skin: Skin is warm and dry.  Psychiatric: She has a normal mood and affect.  Nursing note and vitals reviewed.    ED Treatments / Results  Labs (all labs ordered are listed, but only abnormal results are  displayed) Labs Reviewed  WET PREP, GENITAL - Abnormal; Notable for the following:       Result Value   Clue Cells Wet Prep HPF POC PRESENT (*)    WBC, Wet Prep HPF POC FEW (*)    All other components within normal limits  URINALYSIS, COMPLETE (UACMP) WITH MICROSCOPIC - Abnormal; Notable for the following:    Color, Urine YELLOW (*)    APPearance HAZY (*)    Hgb urine  dipstick SMALL (*)    Leukocytes, UA TRACE (*)    Bacteria, UA RARE (*)    Squamous Epithelial / LPF 0-5 (*)    All other components within normal limits  CBC - Abnormal; Notable for the following:    Hemoglobin 11.1 (*)    HCT 34.0 (*)    MCV 77.5 (*)    MCH 25.3 (*)    RDW 16.9 (*)    All other components within normal limits  CHLAMYDIA/NGC RT PCR ([facility] ONLY)  URINE CULTURE  PREGNANCY, URINE  BASIC METABOLIC PANEL    EKG  EKG Interpretation None       Radiology No results found.  Procedures Procedures (including critical care time)  Medications Ordered in ED Medications  metroNIDAZOLE (FLAGYL) tablet 500 mg (not administered)  ibuprofen (ADVIL,MOTRIN) tablet 800 mg (800 mg Oral Given 09/07/16 2253)     Initial Impression / Assessment and Plan / ED Course  I have reviewed the triage vital signs and the nursing notes.  Pertinent labs & imaging results that were available during my care of the patient were reviewed by me and considered in my medical decision making (see chart for details).     31 year old female with vaginal discharge and increasing urinary frequency. Urinalysis negative for urinary tract infection. Urine cultures are ordered. Urine pregnant test is negative. Positive clue cells. Patient will start metronidazole. Ibuprofen as needed for any discomfort. She is educated on signs and symptoms return to the ED for.  Final Clinical Impressions(s) / ED Diagnoses   Final diagnoses:  Bacterial vaginitis    New Prescriptions New Prescriptions   METRONIDAZOLE (FLAGYL) 500 MG TABLET    Take 1 tablet (500 mg total) by mouth 2 (two) times daily.     Evon SlackGaines, Raneshia Derick C, PA-C 09/07/16 2316    Emily FilbertWilliams, Jonathan E, MD 09/07/16 [id number]

## 2016-09-07 NOTE — ED Triage Notes (Signed)
States urinary frequency and urgency today.

## 2016-09-07 NOTE — ED Notes (Signed)
Pt has dysuria.  Pt denies low back pain.  Pt reports urinary frequency.  No vag bleeding.  Pt alert.

## 2016-09-09 LAB — URINE CULTURE

## 2016-10-10 ENCOUNTER — Other Ambulatory Visit: Payer: Self-pay | Admitting: Physician Assistant

## 2016-10-10 DIAGNOSIS — R0609 Other forms of dyspnea: Principal | ICD-10-CM

## 2016-10-18 ENCOUNTER — Ambulatory Visit
Admission: RE | Admit: 2016-10-18 | Discharge: 2016-10-18 | Disposition: A | Discharge status: Home / Self Care | Payer: Medicaid Other | Source: Ambulatory Visit | Attending: Physician Assistant | Admitting: Physician Assistant

## 2016-10-18 DIAGNOSIS — R0609 Other forms of dyspnea: Secondary | ICD-10-CM | POA: Insufficient documentation

## 2016-10-18 DIAGNOSIS — J45909 Unspecified asthma, uncomplicated: Secondary | ICD-10-CM | POA: Diagnosis not present

## 2016-10-18 NOTE — Progress Notes (Signed)
*  PRELIMINARY RESULTS* Echocardiogram 2D Echocardiogram has been performed.  Cristela BlueHege, Prabhleen Montemayor 10/18/2016, 11:24 AM

## 2017-11-28 ENCOUNTER — Emergency Department
Admission: EM | Admit: 2017-11-28 | Discharge: 2017-11-28 | Disposition: A | Discharge status: Home / Self Care | Payer: Self-pay | Attending: Student in an Organized Health Care Education/Training Program | Admitting: Student in an Organized Health Care Education/Training Program

## 2017-11-28 ENCOUNTER — Emergency Department: Payer: Self-pay

## 2017-11-28 ENCOUNTER — Encounter: Payer: Self-pay | Admitting: Emergency Medicine

## 2017-11-28 ENCOUNTER — Other Ambulatory Visit: Payer: Self-pay

## 2017-11-28 DIAGNOSIS — S0990XA Unspecified injury of head, initial encounter: Secondary | ICD-10-CM

## 2017-11-28 DIAGNOSIS — Y999 Unspecified external cause status: Secondary | ICD-10-CM | POA: Insufficient documentation

## 2017-11-28 DIAGNOSIS — Y929 Unspecified place or not applicable: Secondary | ICD-10-CM | POA: Insufficient documentation

## 2017-11-28 DIAGNOSIS — S39012A Strain of muscle, fascia and tendon of lower back, initial encounter: Secondary | ICD-10-CM

## 2017-11-28 DIAGNOSIS — Y939 Activity, unspecified: Secondary | ICD-10-CM | POA: Insufficient documentation

## 2017-11-28 DIAGNOSIS — R55 Syncope and collapse: Secondary | ICD-10-CM | POA: Insufficient documentation

## 2017-11-28 DIAGNOSIS — J45909 Unspecified asthma, uncomplicated: Secondary | ICD-10-CM | POA: Insufficient documentation

## 2017-11-28 DIAGNOSIS — S60221A Contusion of right hand, initial encounter: Secondary | ICD-10-CM

## 2017-11-28 DIAGNOSIS — S161XXA Strain of muscle, fascia and tendon at neck level, initial encounter: Secondary | ICD-10-CM

## 2017-11-28 LAB — POCT PREGNANCY, URINE: PREG TEST UR: NEGATIVE

## 2017-11-28 MED ORDER — BACLOFEN 10 MG PO TABS: 10.0000 mg | ORAL_TABLET | Freq: Three times a day (TID) | ORAL | 1 refills | Status: AC

## 2017-11-28 MED ORDER — MELOXICAM 15 MG PO TABS: 15.0000 mg | ORAL_TABLET | Freq: Every day | ORAL | 2 refills | Status: AC
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
## 2017-11-28 NOTE — Discharge Instructions (Addendum)
Follow-up with your regular doctor if not better in 3 days.  Apply ice to all areas that hurt.  Use the medications as prescribed.  Return to the emergency department if you are worsening.  If you develop any abdominal pain please return emergency department immediately.
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
## 2018-01-18 ENCOUNTER — Other Ambulatory Visit: Payer: Self-pay

## 2018-01-18 ENCOUNTER — Emergency Department (HOSPITAL_COMMUNITY): Payer: Self-pay

## 2018-01-18 ENCOUNTER — Emergency Department (HOSPITAL_COMMUNITY)
Admission: EM | Admit: 2018-01-18 | Discharge: 2018-01-18 | Disposition: A | Discharge status: Home / Self Care | Payer: Self-pay | Attending: Emergency Medicine | Admitting: Emergency Medicine

## 2018-01-18 ENCOUNTER — Encounter (HOSPITAL_COMMUNITY): Payer: Self-pay

## 2018-01-18 DIAGNOSIS — R1012 Left upper quadrant pain: Secondary | ICD-10-CM | POA: Insufficient documentation

## 2018-01-18 DIAGNOSIS — Z6841 Body Mass Index (BMI) 40.0 and over, adult: Secondary | ICD-10-CM | POA: Insufficient documentation

## 2018-01-18 DIAGNOSIS — R109 Unspecified abdominal pain: Secondary | ICD-10-CM

## 2018-01-18 DIAGNOSIS — R1032 Left lower quadrant pain: Secondary | ICD-10-CM | POA: Insufficient documentation

## 2018-01-18 DIAGNOSIS — R0602 Shortness of breath: Secondary | ICD-10-CM | POA: Insufficient documentation

## 2018-01-18 LAB — I-STAT BETA HCG BLOOD, ED (MC, WL, AP ONLY): I-stat hCG, quantitative: <5 m[IU]/mL (ref <5)

## 2018-01-18 LAB — COMPREHENSIVE METABOLIC PANEL
ALT: 10 U/L (ref 0–44)
AST: 11 U/L — ABNORMAL LOW (ref 15–41)
Albumin: 3 g/dL — ABNORMAL LOW (ref 3.5–5.0)
Alkaline Phosphatase: 49 U/L (ref 38–126)
Anion gap: 10 (ref 5–15)
BUN: 10 mg/dL (ref 6–20)
CO2: 25 mmol/L (ref 22–32)
Calcium: 8.9 mg/dL (ref 8.9–10.3)
Chloride: 102 mmol/L (ref 98–111)
Creatinine, Ser: 0.69 mg/dL (ref 0.44–1.00)
GFR calc Af Amer: >60 mL/min (ref >60)
GFR calc non Af Amer: >60 mL/min (ref >60)
Glucose, Bld: 105 mg/dL — ABNORMAL HIGH (ref 70–99)
Potassium: 4.3 mmol/L (ref 3.5–5.1)
Sodium: 137 mmol/L (ref 135–145)
Total Bilirubin: 0.5 mg/dL (ref 0.3–1.2)
Total Protein: 7.1 g/dL (ref 6.5–8.1)

## 2018-01-18 LAB — CBC WITH DIFFERENTIAL/PLATELET
Abs Immature Granulocytes: 0.02 10*3/uL (ref 0.00–0.07)
Basophils Absolute: 0 10*3/uL (ref 0.0–0.1)
Basophils Relative: 0 %
Eosinophils Absolute: 0.4 10*3/uL (ref 0.0–0.5)
Eosinophils Relative: 5 %
HCT: 31.9 % — ABNORMAL LOW (ref 36.0–46.0)
Hemoglobin: 9 g/dL — ABNORMAL LOW (ref 12.0–15.0)
Immature Granulocytes: 0 %
Lymphocytes Relative: 30 %
Lymphs Abs: 2.5 10*3/uL (ref 0.7–4.0)
MCH: 22.6 pg — ABNORMAL LOW (ref 26.0–34.0)
MCHC: 28.2 g/dL — ABNORMAL LOW (ref 30.0–36.0)
MCV: 79.9 fL — ABNORMAL LOW (ref 80.0–100.0)
Monocytes Absolute: 0.5 10*3/uL (ref 0.1–1.0)
Monocytes Relative: 6 %
Neutro Abs: 4.9 10*3/uL (ref 1.7–7.7)
Neutrophils Relative %: 59 %
Platelets: 211 10*3/uL (ref 150–400)
RBC: 3.99 MIL/uL (ref 3.87–5.11)
RDW: 17.1 % — ABNORMAL HIGH (ref 11.5–15.5)
WBC: 8.3 10*3/uL (ref 4.0–10.5)
nRBC: 0 % (ref 0.0–0.2)

## 2018-01-18 LAB — URINALYSIS, ROUTINE W REFLEX MICROSCOPIC
Bilirubin Urine: NEGATIVE
Glucose, UA: NEGATIVE mg/dL
Ketones, ur: NEGATIVE mg/dL
Leukocytes, UA: NEGATIVE
Nitrite: NEGATIVE
Protein, ur: NEGATIVE mg/dL
RBC / HPF: >50 RBC/hpf — ABNORMAL HIGH (ref 0–5)
Specific Gravity, Urine: 1.021 (ref 1.005–1.030)
pH: 5 (ref 5.0–8.0)

## 2018-01-18 LAB — LIPASE, BLOOD: Lipase: 32 U/L (ref 11–51)

## 2018-01-18 MED ORDER — KETOROLAC TROMETHAMINE 30 MG/ML IJ SOLN
30.0000 mg | Freq: Once | INTRAMUSCULAR | Status: AC
  Administered 2018-01-18: 30 mg via INTRAVENOUS
  Filled 2018-01-18: qty 1

## 2018-01-18 MED ORDER — IBUPROFEN 600 MG PO TABS: 600.0000 mg | ORAL_TABLET | Freq: Four times a day (QID) | ORAL | 0 refills | Status: DC | PRN

## 2018-01-18 MED ORDER — ACETAMINOPHEN 500 MG PO TABS: 500.0000 mg | ORAL_TABLET | Freq: Four times a day (QID) | ORAL | 0 refills | Status: DC | PRN

## 2018-01-18 NOTE — Discharge Instructions (Addendum)
1. Medications: Alternate 600 mg of ibuprofen and [phone] mg of Tylenol every 3 hours as needed for pain. Do not exceed 4000 mg of Tylenol daily.  Take ibuprofen with food to avoid upset stomach issues.  You can take the muscle relaxer (baclofen)  as needed for muscle spasm up to twice daily but do not drive, drink alcohol, or operate heavy machinery while taking this medicine because it may make you drowsy.  I typically recommend taking this medicine only at night when you are going to sleep.  You can also cut these tablets in half if they make you feel very drowsy. 2. Treatment: rest, drink plenty of fluids, gentle stretching as discussed (see attached), alternate ice and heat (or stick with whichever feels best) 20 minutes on 20 minutes off. 3. Follow Up: Please followup with your primary doctor in 3-7 days for discussion of your diagnoses and further evaluation after today's visit; if you do not have a primary care doctor use the resource guide provided to find one;  Return to the ER for worsening back pain, fevers, persistent vomiting, difficulty walking, loss of bowel or bladder control or other concerning symptoms
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
## 2018-01-18 NOTE — ED Notes (Signed)
Pt aware we need urine, sample cup at bedside.

## 2018-01-18 NOTE — ED Notes (Signed)
Patient verbalizes understanding of discharge instructions. Opportunity for questioning and answers were provided. Armband removed by staff, pt discharged from ED ambulatory.   

## 2018-01-18 NOTE — ED Notes (Signed)
Patient transported to X-ray 
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
## 2018-01-19 LAB — URINE CULTURE

## 2021-04-16 ENCOUNTER — Encounter (HOSPITAL_COMMUNITY): Payer: Self-pay

## 2021-04-16 ENCOUNTER — Emergency Department (HOSPITAL_COMMUNITY): Payer: BC Managed Care – PPO

## 2021-04-16 ENCOUNTER — Emergency Department (HOSPITAL_COMMUNITY)
Admission: EM | Admit: 2021-04-16 | Discharge: 2021-04-17 | Disposition: A | Discharge status: Home / Self Care | Payer: BC Managed Care – PPO | Attending: Emergency Medicine | Admitting: Emergency Medicine

## 2021-04-16 ENCOUNTER — Other Ambulatory Visit: Payer: Self-pay

## 2021-04-16 DIAGNOSIS — R1030 Lower abdominal pain, unspecified: Secondary | ICD-10-CM | POA: Diagnosis present

## 2021-04-16 DIAGNOSIS — R1031 Right lower quadrant pain: Secondary | ICD-10-CM | POA: Insufficient documentation

## 2021-04-16 DIAGNOSIS — R1032 Left lower quadrant pain: Secondary | ICD-10-CM | POA: Diagnosis not present

## 2021-04-16 DIAGNOSIS — R102 Pelvic and perineal pain: Secondary | ICD-10-CM | POA: Insufficient documentation

## 2021-04-16 DIAGNOSIS — J45909 Unspecified asthma, uncomplicated: Secondary | ICD-10-CM | POA: Diagnosis not present

## 2021-04-16 LAB — CBC WITH DIFFERENTIAL/PLATELET
Abs Immature Granulocytes: 0.03 10*3/uL (ref 0.00–0.07)
Basophils Absolute: 0 10*3/uL (ref 0.0–0.1)
Basophils Relative: 0 %
Eosinophils Absolute: 0.3 10*3/uL (ref 0.0–0.5)
Eosinophils Relative: 4 %
HCT: 41.5 % (ref 36.0–46.0)
Hemoglobin: 12.3 g/dL (ref 12.0–15.0)
Immature Granulocytes: 0 %
Lymphocytes Relative: 30 %
Lymphs Abs: 2.7 10*3/uL (ref 0.7–4.0)
MCH: 25.6 pg — ABNORMAL LOW (ref 26.0–34.0)
MCHC: 29.6 g/dL — ABNORMAL LOW (ref 30.0–36.0)
MCV: 86.5 fL (ref 80.0–100.0)
Monocytes Absolute: 0.4 10*3/uL (ref 0.1–1.0)
Monocytes Relative: 5 %
Neutro Abs: 5.5 10*3/uL (ref 1.7–7.7)
Neutrophils Relative %: 61 %
Platelets: 206 10*3/uL (ref 150–400)
RBC: 4.8 MIL/uL (ref 3.87–5.11)
RDW: 15.3 % (ref 11.5–15.5)
WBC: 9 10*3/uL (ref 4.0–10.5)
nRBC: 0 % (ref 0.0–0.2)

## 2021-04-16 LAB — COMPREHENSIVE METABOLIC PANEL
ALT: 14 U/L (ref 0–44)
AST: 18 U/L (ref 15–41)
Albumin: 3.6 g/dL (ref 3.5–5.0)
Alkaline Phosphatase: 54 U/L (ref 38–126)
Anion gap: 8 (ref 5–15)
BUN: 12 mg/dL (ref 6–20)
CO2: 28 mmol/L (ref 22–32)
Calcium: 8.7 mg/dL — ABNORMAL LOW (ref 8.9–10.3)
Chloride: 101 mmol/L (ref 98–111)
Creatinine, Ser: 0.77 mg/dL (ref 0.44–1.00)
GFR, Estimated: >60 mL/min (ref >60)
Glucose, Bld: 109 mg/dL — ABNORMAL HIGH (ref 70–99)
Potassium: 4.5 mmol/L (ref 3.5–5.1)
Sodium: 137 mmol/L (ref 135–145)
Total Bilirubin: 0.6 mg/dL (ref 0.3–1.2)
Total Protein: 7.9 g/dL (ref 6.5–8.1)

## 2021-04-16 LAB — URINALYSIS, ROUTINE W REFLEX MICROSCOPIC
Bilirubin Urine: NEGATIVE
Glucose, UA: NEGATIVE mg/dL
Hgb urine dipstick: NEGATIVE
Ketones, ur: NEGATIVE mg/dL
Leukocytes,Ua: NEGATIVE
Nitrite: POSITIVE — AB
Protein, ur: NEGATIVE mg/dL
Specific Gravity, Urine: 1.024 (ref 1.005–1.030)
pH: 7 (ref 5.0–8.0)

## 2021-04-16 LAB — I-STAT BETA HCG BLOOD, ED (MC, WL, AP ONLY): I-stat hCG, quantitative: <5 m[IU]/mL (ref <5)

## 2021-04-16 LAB — LIPASE, BLOOD: Lipase: 28 U/L (ref 11–51)

## 2021-04-16 MED ORDER — MORPHINE SULFATE (PF) 4 MG/ML IV SOLN
4.0000 mg | Freq: Once | INTRAVENOUS | Status: AC
  Administered 2021-04-16: 4 mg via INTRAVENOUS
  Filled 2021-04-16: qty 1

## 2021-04-16 MED ORDER — IOHEXOL 300 MG/ML  SOLN
100.0000 mL | Freq: Once | INTRAMUSCULAR | Status: AC | PRN
  Administered 2021-04-16: 100 mL via INTRAVENOUS

## 2021-04-16 MED ORDER — ONDANSETRON HCL 4 MG/2ML IJ SOLN
4.0000 mg | Freq: Once | INTRAMUSCULAR | Status: AC
  Administered 2021-04-16: 4 mg via INTRAVENOUS
  Filled 2021-04-16: qty 2

## 2021-04-16 NOTE — ED Provider Notes (Signed)
[facility]-EMERGENCY DEPT Provider Note   CSN: [id number] Arrival date & time: 04/16/21  2128     History  Chief Complaint  Patient presents with   Abdominal Pain    Deborah Chambers is a 36 y.o. female with a past medical history of asthma and tubal ligation.  Presents emergency department with a chief complaint of abdominal pain.  Patient reports that abdominal pain started today at 2 PM.  Patient reports that pain started gradually and has gotten progressively worse over time.  Pain started as a "ache," and now she describes as a "pressure."  Pain is located across her lower abdomen.  Patient rates pain 9/10 on the pain scale.  Pain is worse with certain positions.  Patient has not tried any modalities to alleviate her symptoms.  Patient states that she noticed that her urine has been "dark with a strong odor," since last week.  Patient denies any fevers, chills, abdominal distention, melena, blood in stool, nausea, vomiting, dysuria, hematuria, urinary urgency, vaginal pain, vaginal bleeding, vaginal discharge.  Patient endorses occasional alcohol use.  Denies any illicit drug use.  Patient is sexually active in a mutually monogamous relationship with a female partner.  Patient states last menstrual period was mid February.  Patient is on any forms of birth control however did have tubal ligation.   Abdominal Pain Associated symptoms: no chest pain, no chills, no constipation, no diarrhea, no dysuria, no fever, no hematuria, no nausea, no shortness of breath, no vaginal bleeding, no vaginal discharge and no vomiting       Home Medications Prior to Admission medications   Medication Sig Start Date End Date Taking? Authorizing Provider  acetaminophen (TYLENOL) 500 MG tablet Take 1 tablet (500 mg total) by mouth every 6 (six) hours as needed. 01/18/18   Fawze, Mina A, PA-C  ibuprofen (ADVIL,MOTRIN) 600 MG tablet Take 1 tablet (600 mg total) by mouth every 6 (six) hours as  needed. 01/18/18   Michela Pitcher A, PA-C      Allergies    Patient has no known allergies.    Review of Systems   Review of Systems  Constitutional:  Negative for chills and fever.  Eyes:  Negative for visual disturbance.  Respiratory:  Negative for shortness of breath.   Cardiovascular:  Negative for chest pain.  Gastrointestinal:  Positive for abdominal pain. Negative for abdominal distention, anal bleeding, blood in stool, constipation, diarrhea, nausea, rectal pain and vomiting.  Genitourinary:  Negative for difficulty urinating, dysuria, frequency, genital sores, hematuria, pelvic pain, urgency, vaginal bleeding, vaginal discharge and vaginal pain.  Musculoskeletal:  Negative for back pain and neck pain.  Skin:  Negative for color change and rash.  Neurological:  Negative for dizziness, syncope, light-headedness and headaches.  Psychiatric/Behavioral:  Negative for confusion.    Physical Exam Updated Vital Signs BP (!) 168/108 (BP Location: Left Wrist)    Pulse 85    Temp 97.7 F (36.5 C) (Oral)    Resp 18    Ht 5\' 5"  (1.651 m)    Wt (!) 206.4 kg    SpO2 97%    BMI 75.72 kg/m  Physical Exam Vitals and nursing note reviewed.  Constitutional:      General: She is not in acute distress.    Appearance: She is morbidly obese. She is not ill-appearing, toxic-appearing or diaphoretic.  HENT:     Head: Normocephalic.  Eyes:     General: No scleral icterus.  Right eye: No discharge.        Left eye: No discharge.  Cardiovascular:     Rate and Rhythm: Normal rate.  Pulmonary:     Effort: Pulmonary effort is normal.  Abdominal:     General: Bowel sounds are normal. There is no distension. There are no signs of injury.     Palpations: Abdomen is soft. There is no mass or pulsatile mass.     Tenderness: There is abdominal tenderness in the right lower quadrant, suprapubic area and left lower quadrant. There is no guarding or rebound.     Hernia: There is no hernia in the  umbilical area or ventral area.  Skin:    General: Skin is warm and dry.  Neurological:     General: No focal deficit present.     Mental Status: She is alert.  Psychiatric:        Behavior: Behavior is cooperative.    ED Results / Procedures / Treatments   Labs (all labs ordered are listed, but only abnormal results are displayed) Labs Reviewed  COMPREHENSIVE METABOLIC PANEL - Abnormal; Notable for the following components:      Result Value   Glucose, Bld 109 (*)    Calcium 8.7 (*)    All other components within normal limits  CBC WITH DIFFERENTIAL/PLATELET - Abnormal; Notable for the following components:   MCH 25.6 (*)    MCHC 29.6 (*)    All other components within normal limits  URINALYSIS, ROUTINE W REFLEX MICROSCOPIC - Abnormal; Notable for the following components:   APPearance HAZY (*)    Nitrite POSITIVE (*)    Bacteria, UA RARE (*)    All other components within normal limits  LIPASE, BLOOD  I-STAT BETA HCG BLOOD, ED ([facility], [facility], [facility] ONLY)    EKG None  Radiology US Transvaginal Non-OB  Result Date: 04/16/2021 CLINICAL DATA:  Initial evaluation for acute pelvic pain. EXAM: TRANSABDOMINAL AND TRANSVAGINAL ULTRASOUND OF PELVIS TECHNIQUE: Both transabdominal and transvaginal ultrasound examinations of the pelvis were performed. Transabdominal technique was performed for global imaging of the pelvis including uterus, ovaries, adnexal regions, and pelvic cul-de-sac. It was necessary to proceed with endovaginal exam following the transabdominal exam to visualize the endometrium and ovaries. COMPARISON:  CT from earlier the same day. FINDINGS: Uterus Measurements: 14.8 x 7.4 x 9.2 cm = volume: 519.3 mL. Uterus is diffusely enlarged with somewhat globular contour or and bulky myometrial thickening at the fundus, which could reflect underlying adenomyosis. No discrete fibroid or other myometrial abnormality. Endometrium Thickness: 8.5 mm.  No focal abnormality visualized. Right  ovary Not visualized.  No adnexal mass. Left ovary Not visualized.  No adnexal mass. Other findings No abnormal free fluid. IMPRESSION: 1. Enlarged uterus with somewhat globular contour, which could reflect underlying adenomyosis. No discrete fibroid or other myometrial abnormality. 2. Normal endometrium measuring 8.5 mm in thickness. 3. Nonvisualization of either ovary. No adnexal mass or free fluid. No other acute abnormality. Electronically Signed   By: Rise Mu M.D.   On: 04/16/2021 23:33   US Pelvis Complete  Result Date: 04/16/2021 CLINICAL DATA:  Initial evaluation for acute pelvic pain. EXAM: TRANSABDOMINAL AND TRANSVAGINAL ULTRASOUND OF PELVIS TECHNIQUE: Both transabdominal and transvaginal ultrasound examinations of the pelvis were performed. Transabdominal technique was performed for global imaging of the pelvis including uterus, ovaries, adnexal regions, and pelvic cul-de-sac. It was necessary to proceed with endovaginal exam following the transabdominal exam to visualize the endometrium and ovaries. COMPARISON:  CT  from earlier the same day. FINDINGS: Uterus Measurements: 14.8 x 7.4 x 9.2 cm = volume: 519.3 mL. Uterus is diffusely enlarged with somewhat globular contour or and bulky myometrial thickening at the fundus, which could reflect underlying adenomyosis. No discrete fibroid or other myometrial abnormality. Endometrium Thickness: 8.5 mm.  No focal abnormality visualized. Right ovary Not visualized.  No adnexal mass. Left ovary Not visualized.  No adnexal mass. Other findings No abnormal free fluid. IMPRESSION: 1. Enlarged uterus with somewhat globular contour, which could reflect underlying adenomyosis. No discrete fibroid or other myometrial abnormality. 2. Normal endometrium measuring 8.5 mm in thickness. 3. Nonvisualization of either ovary. No adnexal mass or free fluid. No other acute abnormality. Electronically Signed   By: Rise Mu M.D.   On: 04/16/2021 23:33    CT ABDOMEN PELVIS W CONTRAST  Result Date: 04/16/2021 CLINICAL DATA:  Right lower quadrant abdominal pain. EXAM: CT ABDOMEN AND PELVIS WITH CONTRAST TECHNIQUE: Multidetector CT imaging of the abdomen and pelvis was performed using the standard protocol following bolus administration of intravenous contrast. RADIATION DOSE REDUCTION: This exam was performed according to the departmental dose-optimization program which includes automated exposure control, adjustment of the mA and/or kV according to patient size and/or use of iterative reconstruction technique. CONTRAST:  OMNIPAQUE IOHEXOL 300 MG/ML  SOLN COMPARISON:  CT abdomen and pelvis 01/18/2018. FINDINGS: Lower chest: No acute abnormality. Hepatobiliary: Multiple gallstones are again seen. There is no pericholecystic inflammation or biliary ductal dilatation. The liver is within normal limits. Pancreas: Unremarkable. No pancreatic ductal dilatation or surrounding inflammatory changes. Spleen: Normal in size without focal abnormality. Adrenals/Urinary Tract: Adrenal glands are unremarkable. Kidneys are normal, without renal calculi, focal lesion, or hydronephrosis. Bladder is unremarkable. Stomach/Bowel: Stomach is within normal limits. No evidence of bowel wall thickening, distention, or inflammatory changes. The appendix is not visualized. Vascular/Lymphatic: No significant vascular findings are present. No enlarged abdominal or pelvic lymph nodes. Reproductive: Uterus is prominent size, unchanged. Ovaries are unremarkable. Other: Small fat containing umbilical hernia is unchanged. No ascites or free air. Musculoskeletal: No acute or significant osseous findings. IMPRESSION: 1.  No acute localizing process in the abdomen or pelvis. 2.  Stable prominent uterus, likely related to fibroid change. 3.  Stable small fat containing umbilical hernia. 4.  Stable cholelithiasis. Electronically Signed   By: Darliss Cheney M.D.   On: 04/16/2021 23:21     Procedures Procedures    Medications Ordered in ED Medications  morphine (PF) 4 MG/ML injection 4 mg (4 mg Intravenous Given 04/16/21 2236)  ondansetron (ZOFRAN) injection 4 mg (4 mg Intravenous Given 04/16/21 2236)  iohexol (OMNIPAQUE) 300 MG/ML solution 100 mL (100 mLs Intravenous Contrast Given 04/16/21 2301)    ED Course/ Medical Decision Making/ A&P                           Medical Decision Making Amount and/or Complexity of Data Reviewed Labs: ordered. Radiology: ordered. ECG/medicine tests: ordered.  Risk Prescription drug management.   Alert 36 year old female no acute distress, nontoxic-appearing.  Presents to the ED with a chief complaint of lower abdominal pain.  Information obtained from patient.  Past medical records reviewed including previous provider notes, labs, and imaging.  Patient has past medical history of morbid obesity which complicates her care.  On exam patient has tenderness to lower abdomen below her pannus.  Due to lower abdominal pain differential includes but is not limited to diverticulitis, appendicitis, ovarian torsion, renal  calculus, gastroenteritis.  We will obtain CMP, CBC, lipase, urinalysis, CT abdomen pelvis.  Patient given morphine and Zofran.  Labs were independently reviewed and interpreted by myself.  Pertinent findings include: -CMP, CBC, lipase, beta-hCG unremarkable -Urinalysis shows bacteria rare, leukocyte negative, nitrite positive.  I personally viewed and interpreted CT abdomen pelvis.  I agree with radiology interpretation of no acute localizing process in the abdomen or pelvis.  Stable prominent uterus, stable small fat-containing umbilical hernia, stable cholelithiasis, ovaries unremarkable.  Ultrasound imaging of pelvis showed enlarged uterus with somewhat globular contour, normal endometrium measuring 8.5 mm in thickness, no adnexal mass or free fluid.  On serial repeat examination abdomen remains soft, nondistended,  with resolution of tenderness.  Patient reports pain is improved after receiving medication.  While urinalysis shows bacteria rare patient is not having any urinary symptoms.  Decision making with patient with decision to hold any antibiotics at this time.  Patient to follow-up with PCP for testing/treatment for UTI if she starts developing urinary symptoms.  Discussed results, findings, treatment and follow up. Patient advised of return precautions. Patient verbalized understanding and agreed with plan.  Patient care discussed with attending physician Dr. Durwin Noraixon        Final Clinical Impression(s) / ED Diagnoses Final diagnoses:  Lower abdominal pain    Rx / DC Orders ED Discharge Orders     None         Haskel SchroederBadalamente, Matalynn Graff R, PA-C 04/17/21 0530    Gloris Manchesterixon, Ryan, MD 04/20/21 1148

## 2021-04-16 NOTE — ED Triage Notes (Signed)
Pt reports with mid lower abdominal pain since 2 pm. Pt states that her urine is kind of dark.  ?

## 2021-04-17 NOTE — Discharge Instructions (Signed)
You came to the emergency department today to be evaluated for your abdominal pain.  Your physical exam, lab work, and imaging were reassuring that you are not having acute emergency today.  Please follow-up with your primary care provider for repeat evaluation. ? ?Please take Tylenol (acetaminophen) to relieve your pain.  You make take tylenol, up to 1,000 mg (two extra strength pills) every 8 hours as needed.  Do not take more than 3,000 mg tylenol in a 24 hour period (not more than one dose every 8 hours.  Please check all medication labels as many medications such as pain and cold medications may contain tylenol.  Do not drink alcohol while taking these medications. ? ? ?Get help right away if: ?Your pain does not go away as soon as your doctor says it should. ?You cannot stop vomiting. ?Your pain is only in areas of your belly, such as the right side or the left lower part of the belly. ?You have bloody or black poop, or poop that looks like tar. ?You have very bad pain, cramping, or bloating in your belly. ?You have signs of not having enough fluid or water in your body (dehydration), such as: ?Dark pee, very little pee, or no pee. ?Cracked lips. ?Dry mouth. ?Sunken eyes. ?Sleepiness. ?Weakness. ?You have trouble breathing or chest pain. ?

## 2022-02-04 ENCOUNTER — Other Ambulatory Visit: Payer: Self-pay

## 2022-02-04 ENCOUNTER — Emergency Department (HOSPITAL_BASED_OUTPATIENT_CLINIC_OR_DEPARTMENT_OTHER): Payer: BLUE CROSS/BLUE SHIELD | Admitting: Radiology

## 2022-02-04 ENCOUNTER — Emergency Department (HOSPITAL_BASED_OUTPATIENT_CLINIC_OR_DEPARTMENT_OTHER): Payer: BLUE CROSS/BLUE SHIELD

## 2022-02-04 ENCOUNTER — Emergency Department (HOSPITAL_BASED_OUTPATIENT_CLINIC_OR_DEPARTMENT_OTHER)
Admission: EM | Admit: 2022-02-04 | Discharge: 2022-02-04 | Disposition: A | Discharge status: Home / Self Care | Payer: BLUE CROSS/BLUE SHIELD | Attending: Emergency Medicine | Admitting: Emergency Medicine

## 2022-02-04 DIAGNOSIS — J189 Pneumonia, unspecified organism: Secondary | ICD-10-CM | POA: Diagnosis not present

## 2022-02-04 DIAGNOSIS — Z1152 Encounter for screening for COVID-19: Secondary | ICD-10-CM | POA: Diagnosis not present

## 2022-02-04 DIAGNOSIS — Z7951 Long term (current) use of inhaled steroids: Secondary | ICD-10-CM | POA: Insufficient documentation

## 2022-02-04 DIAGNOSIS — R0602 Shortness of breath: Secondary | ICD-10-CM | POA: Diagnosis present

## 2022-02-04 LAB — CBC WITH DIFFERENTIAL/PLATELET
Abs Immature Granulocytes: 0.04 10*3/uL (ref 0.00–0.07)
Basophils Absolute: 0 10*3/uL (ref 0.0–0.1)
Basophils Relative: 0 %
Eosinophils Absolute: 0.3 10*3/uL (ref 0.0–0.5)
Eosinophils Relative: 2 %
HCT: 36.6 % (ref 36.0–46.0)
Hemoglobin: 11 g/dL — ABNORMAL LOW (ref 12.0–15.0)
Immature Granulocytes: 0 %
Lymphocytes Relative: 13 %
Lymphs Abs: 1.6 10*3/uL (ref 0.7–4.0)
MCH: 24.6 pg — ABNORMAL LOW (ref 26.0–34.0)
MCHC: 30.1 g/dL (ref 30.0–36.0)
MCV: 81.7 fL (ref 80.0–100.0)
Monocytes Absolute: 0.5 10*3/uL (ref 0.1–1.0)
Monocytes Relative: 4 %
Neutro Abs: 9.5 10*3/uL — ABNORMAL HIGH (ref 1.7–7.7)
Neutrophils Relative %: 81 %
Platelets: 219 10*3/uL (ref 150–400)
RBC: 4.48 MIL/uL (ref 3.87–5.11)
RDW: 16.7 % — ABNORMAL HIGH (ref 11.5–15.5)
WBC: 11.9 10*3/uL — ABNORMAL HIGH (ref 4.0–10.5)
nRBC: 0 % (ref 0.0–0.2)

## 2022-02-04 LAB — RESP PANEL BY RT-PCR (RSV, FLU A&B, COVID)  RVPGX2
Influenza A by PCR: NEGATIVE
Influenza B by PCR: NEGATIVE
Resp Syncytial Virus by PCR: NEGATIVE
SARS Coronavirus 2 by RT PCR: NEGATIVE

## 2022-02-04 LAB — BASIC METABOLIC PANEL
Anion gap: 12 (ref 5–15)
BUN: 9 mg/dL (ref 6–20)
CO2: 28 mmol/L (ref 22–32)
Calcium: 9.4 mg/dL (ref 8.9–10.3)
Chloride: 98 mmol/L (ref 98–111)
Creatinine, Ser: 0.68 mg/dL (ref 0.44–1.00)
GFR, Estimated: >60 mL/min (ref >60)
Glucose, Bld: 129 mg/dL — ABNORMAL HIGH (ref 70–99)
Potassium: 3.5 mmol/L (ref 3.5–5.1)
Sodium: 138 mmol/L (ref 135–145)

## 2022-02-04 LAB — BRAIN NATRIURETIC PEPTIDE: B Natriuretic Peptide: 34.6 pg/mL (ref 0.0–100.0)

## 2022-02-04 MED ORDER — SODIUM CHLORIDE 0.9 % IV SOLN
1.0000 g | Freq: Once | INTRAVENOUS | Status: AC
  Administered 2022-02-04: 1 g via INTRAVENOUS
  Filled 2022-02-04: qty 10

## 2022-02-04 MED ORDER — DEXAMETHASONE SODIUM PHOSPHATE 10 MG/ML IJ SOLN: 10.0000 mg | Freq: Once | INTRAMUSCULAR | Status: DC

## 2022-02-04 MED ORDER — ALBUTEROL SULFATE HFA 108 (90 BASE) MCG/ACT IN AERS: 2.0000 | INHALATION_SPRAY | RESPIRATORY_TRACT | Status: DC | PRN

## 2022-02-04 MED ORDER — DOXYCYCLINE HYCLATE 100 MG PO CAPS: 100.0000 mg | ORAL_CAPSULE | Freq: Two times a day (BID) | ORAL | 0 refills | Status: AC

## 2022-02-04 MED ORDER — DEXAMETHASONE SODIUM PHOSPHATE 10 MG/ML IJ SOLN
10.0000 mg | Freq: Once | INTRAMUSCULAR | Status: AC
  Administered 2022-02-04: 10 mg via INTRAVENOUS
  Filled 2022-02-04: qty 1

## 2022-02-04 MED ORDER — SODIUM CHLORIDE 0.9 % IV SOLN
500.0000 mg | Freq: Once | INTRAVENOUS | Status: AC
  Administered 2022-02-04: 500 mg via INTRAVENOUS
  Filled 2022-02-04: qty 5

## 2022-02-04 MED ORDER — IOHEXOL 350 MG/ML SOLN
100.0000 mL | Freq: Once | INTRAVENOUS | Status: AC | PRN
  Administered 2022-02-04: 100 mL via INTRAVENOUS

## 2022-02-04 MED ORDER — ALBUTEROL SULFATE HFA 108 (90 BASE) MCG/ACT IN AERS
2.0000 | INHALATION_SPRAY | RESPIRATORY_TRACT | Status: DC | PRN
  Filled 2022-02-04: qty 6.7

## 2022-02-04 MED ORDER — ACETAMINOPHEN 500 MG PO TABS
1000.0000 mg | ORAL_TABLET | Freq: Once | ORAL | Status: AC
  Administered 2022-02-04: 1000 mg via ORAL
  Filled 2022-02-04: qty 2

## 2022-02-04 MED ORDER — SODIUM CHLORIDE 0.9 % IV BOLUS
1000.0000 mL | Freq: Once | INTRAVENOUS | Status: AC
  Administered 2022-02-04: 1000 mL via INTRAVENOUS

## 2022-02-04 MED ORDER — ALBUTEROL SULFATE HFA 108 (90 BASE) MCG/ACT IN AERS
4.0000 | INHALATION_SPRAY | Freq: Once | RESPIRATORY_TRACT | Status: AC
  Administered 2022-02-04: 4 via RESPIRATORY_TRACT

## 2022-02-04 NOTE — ED Triage Notes (Signed)
Patient arrives with complaints of increased shortness of breath, coughing with green sputum, wheezing x3 days.   Patient also posterior head pain x1 week. No falls or head injuries.

## 2022-02-04 NOTE — Discharge Instructions (Addendum)
It was a pleasure taking care of you today!   Your chest xray showed concerns for pneumonia.  You will be sent a prescription for Doxycycline, take as directed.  Ensure to maintain fluid intake.  You have been given an inhaler today, you may use two puffs every 4 hours as needed for your symptoms. You may follow-up with your primary care provider regarding today's ED visit.  Return to the emergency department if you experience increasing/worsening symptoms.

## 2022-02-04 NOTE — ED Notes (Signed)
Patient instructed with MDI administration. Patient demonstrated goof effort and technique with MDI.

## 2022-02-04 NOTE — ED Provider Notes (Signed)
[facility] EMERGENCY DEPT Provider Note   CSN: [id number] Arrival date & time: 02/04/22  0751     History  Chief Complaint  Patient presents with   Shortness of Breath   Headache    Deborah Chambers is a 36 y.o. female who presents emergency department with concerns for shortness of breath onset 3 days.  Sick contact at home of her child with similar symptoms.  Has associated productive cough with green sputum, wheezing, rhinorrhea, nasal congestion, headache (x 1 week).  Has tried dayquil and nyquil.  Denies chest pain, rhinorrhea, nasal congestion,vision changes, numbness, tingling, weakness.  No recent injury, trauma, fall.    The history is provided by the patient. No language interpreter was used.       Home Medications Prior to Admission medications   Medication Sig Start Date End Date Taking? Authorizing Provider  doxycycline (VIBRAMYCIN) 100 MG capsule Take 1 capsule (100 mg total) by mouth 2 (two) times daily for 5 days. 02/04/22 02/09/22 Yes Yachet Mattson A, PA-C      Allergies    Patient has no known allergies.    Review of Systems   Review of Systems  Respiratory:  Positive for shortness of breath.   Neurological:  Positive for headaches.  All other systems reviewed and are negative.   Physical Exam Updated Vital Signs BP (!) 133/94   Pulse 91   Temp 99.4 F (37.4 C)   Resp 20   Ht 5\' 5"  (1.651 m)   Wt (!) 206 kg   SpO2 (!) 89%   BMI 75.57 kg/m  Physical Exam Vitals and nursing note reviewed.  Constitutional:      General: She is not in acute distress.    Appearance: She is not diaphoretic.  HENT:     Head: Normocephalic and atraumatic.     Mouth/Throat:     Pharynx: No oropharyngeal exudate.  Eyes:     General: No scleral icterus.    Conjunctiva/sclera: Conjunctivae normal.  Cardiovascular:     Rate and Rhythm: Normal rate and regular rhythm.     Pulses: Normal pulses.     Heart sounds: Normal heart sounds.  Pulmonary:      Effort: Pulmonary effort is normal. No respiratory distress.     Breath sounds: Normal breath sounds. No wheezing.  Abdominal:     General: Bowel sounds are normal.     Palpations: Abdomen is soft. There is no mass.     Tenderness: There is no abdominal tenderness. There is no guarding or rebound.  Musculoskeletal:        General: Normal range of motion.     Cervical back: Normal range of motion and neck supple.  Skin:    General: Skin is warm and dry.  Neurological:     Mental Status: She is alert.  Psychiatric:        Behavior: Behavior normal.     ED Results / Procedures / Treatments   Labs (all labs ordered are listed, but only abnormal results are displayed) Labs Reviewed  BASIC METABOLIC PANEL - Abnormal; Notable for the following components:      Result Value   Glucose, Bld 129 (*)    All other components within normal limits  CBC WITH DIFFERENTIAL/PLATELET - Abnormal; Notable for the following components:   WBC 11.9 (*)    Hemoglobin 11.0 (*)    MCH 24.6 (*)    RDW 16.7 (*)    Neutro Abs 9.5 (*)  All other components within normal limits  RESP PANEL BY RT-PCR (RSV, FLU A&B, COVID)  RVPGX2  BRAIN NATRIURETIC PEPTIDE  PREGNANCY, URINE    EKG EKG Interpretation  Date/Time:  Monday February 04 2022 08:02:50 EST Ventricular Rate:  113 PR Interval:  154 QRS Duration: 86 QT Interval:  320 QTC Calculation: 438 R Axis:   73 Text Interpretation: Sinus tachycardia Right atrial enlargement Borderline ECG Nonspecific ST/T change lead III otherwise no contiguous or reciprocal changes No previous ECGs available Confirmed by Vivien Rossetti ([id number]) on 02/04/2022 12:53:41 PM  Radiology CT Angio Chest PE W and/or Wo Contrast  Result Date: 02/04/2022 CLINICAL DATA:  Shortness of breath and cough EXAM: CT ANGIOGRAPHY CHEST WITH CONTRAST TECHNIQUE: Multidetector CT imaging of the chest was performed using the standard protocol during bolus administration of intravenous  contrast. Multiplanar CT image reconstructions and MIPs were obtained to evaluate the vascular anatomy. RADIATION DOSE REDUCTION: This exam was performed according to the departmental dose-optimization program which includes automated exposure control, adjustment of the mA and/or kV according to patient size and/or use of iterative reconstruction technique. CONTRAST:  OMNIPAQUE IOHEXOL 350 MG/ML SOLN COMPARISON:  None Available. FINDINGS: Cardiovascular: No evidence of central pulmonary embolus, limited evaluation of the lobar, segmental and subsegmental pulmonary arteries due to bolus timing and patient body habitus. Normal heart size. No pericardial effusion. Normal caliber thoracic aorta with no atherosclerotic disease. No definite coronary artery calcifications Mediastinum/Nodes: Esophagus and thyroid are unremarkable. No pathologically enlarged lymph nodes seen in the chest Lungs/Pleura: Central airways are patent. Mild ground-glass opacity of the lingula. No consolidation, pleural effusion or pneumothorax. Upper Abdomen: Hepatic steatosis.  No acute abnormality. Musculoskeletal: No chest wall abnormality. No acute or significant osseous findings. Review of the MIP images confirms the above findings. IMPRESSION: 1. No evidence of central pulmonary embolus, markedly limited evaluation due to bolus timing and patient body habitus. 2. Mild ground-glass opacity of the lingula, likely infectious or inflammatory. 3. Hepatic steatosis. Electronically Signed   By: Allegra Lai M.D.   On: 02/04/2022 12:22   DG Chest 2 View  Result Date: 02/04/2022 CLINICAL DATA:  Shortness of breath. EXAM: CHEST - 2 VIEW COMPARISON:  01/18/2018 FINDINGS: Right lung clear. Left base atelectasis or infiltrate noted. Cardiopericardial silhouette is at upper limits of normal for size. There is pulmonary vascular congestion without overt pulmonary edema. The visualized bony structures of the thorax are unremarkable.  IMPRESSION: 1. Left base atelectasis or infiltrate. 2. Pulmonary vascular congestion without overt pulmonary edema. Electronically Signed   By: Kennith Center M.D.   On: 02/04/2022 08:54    Procedures Procedures    Medications Ordered in ED Medications  albuterol (VENTOLIN HFA) 108 (90 Base) MCG/ACT inhaler 2 puff (has no administration in time range)  albuterol (VENTOLIN HFA) 108 (90 Base) MCG/ACT inhaler 4 puff (4 puffs Inhalation Given 02/04/22 0858)  acetaminophen (TYLENOL) tablet 1,000 mg (1,000 mg Oral Given 02/04/22 1019)  cefTRIAXone (ROCEPHIN) 1 g in sodium chloride 0.9 % 100 mL IVPB (0 g Intravenous Stopped 02/04/22 1202)  azithromycin (ZITHROMAX) 500 mg in sodium chloride 0.9 % 250 mL IVPB (0 mg Intravenous Stopped 02/04/22 1202)  dexamethasone (DECADRON) injection 10 mg (10 mg Intravenous Given 02/04/22 1019)  sodium chloride 0.9 % bolus 1,000 mL (0 mLs Intravenous Stopped 02/04/22 1344)  iohexol (OMNIPAQUE) 350 MG/ML injection 100 mL (100 mLs Intravenous Contrast Given 02/04/22 1159)    ED Course/ Medical Decision Making/ A&P Clinical Course as of 02/04/22 1842  Mon Feb 04, 2022  1052 Patient reevaluated and noted mild improvement of her symptoms with treatment regimen in the ED.  [SB]  1258 Discussed with patient results of her lab and imaging studies.  Discussed with patient discharge treatment plan.  Answered all available questions.  Patient appears safe for discharge at this time. [SB]    Clinical Course User Index [SB] Da Authement A, PA-C                           Medical Decision Making Amount and/or Complexity of Data Reviewed Labs: ordered. Radiology: ordered.  Risk OTC drugs. Prescription drug management.   Pt presents with concerns for shortness of breath onset 3 days.  No PE risk factors.  Sick contacts at home with similar symptoms. Vital signs, patient afebrile, tachycardic. On exam, pt with no acute cardiovascular, respiratory, exam findings.  Differential diagnosis includes COVID, flu, viral URI with cough, PNA.   Labs:  I ordered, and personally interpreted labs.  The pertinent results include:   BNP negative BMP negative CBC with leukocytosis at 11.9 Negative COVID, flu, RSV swab  Imaging: I ordered imaging studies including chest x-ray, CTA PE I independently visualized and interpreted imaging which showed: Chest x-ray with  1. Left base atelectasis or infiltrate.  2. Pulmonary vascular congestion without overt pulmonary edema.   CTA PE with  1. No evidence of central pulmonary embolus, markedly limited  evaluation due to bolus timing and patient body habitus.  2. Mild ground-glass opacity of the lingula, likely infectious or  inflammatory.  3. Hepatic steatosis.   I agree with the radiologist interpretation  Medications:  I ordered medication including Rocephin, Zithromax, IV fluids, Tylenol, Decadron, albuterol inhaler for symptom management Reevaluation of the patient after these medicines and interventions, I reevaluated the patient and found that they have improved I have reviewed the patients home medicines and have made adjustments as needed   Disposition: Presentation suspicious for pneumonia.  Doubt COVID, flu, RSV, pulmonary embolism. After consideration of the diagnostic results and the patients response to treatment, I feel that the patient would benefit from Discharge home. Pt discharged home with doxycycline. Supportive care measures and strict return precautions discussed with patient at bedside. Pt acknowledges and verbalizes understanding. Pt appears safe for discharge. Follow up as indicated in discharge paperwork.    This chart was dictated using voice recognition software, Dragon. Despite the best efforts of this provider to proofread and correct errors, errors may still occur which can change documentation meaning.   Final Clinical Impression(s) / ED Diagnoses Final diagnoses:  Pneumonia of  left lower lobe due to infectious organism    Rx / DC Orders ED Discharge Orders          Ordered    doxycycline (VIBRAMYCIN) 100 MG capsule  2 times daily        02/04/22 1258              Elija Mccamish A, PA-C 02/04/22 1845    Mardene Sayer, MD 02/05/22 [phone]

## 2022-02-11 ENCOUNTER — Emergency Department (HOSPITAL_COMMUNITY)
Admission: EM | Admit: 2022-02-11 | Discharge: 2022-02-11 | Disposition: A | Discharge status: Home / Self Care | Payer: BLUE CROSS/BLUE SHIELD | Attending: Emergency Medicine | Admitting: Emergency Medicine

## 2022-02-11 ENCOUNTER — Other Ambulatory Visit: Payer: Self-pay

## 2022-02-11 ENCOUNTER — Emergency Department (HOSPITAL_COMMUNITY): Payer: BLUE CROSS/BLUE SHIELD

## 2022-02-11 DIAGNOSIS — R519 Headache, unspecified: Secondary | ICD-10-CM | POA: Insufficient documentation

## 2022-02-11 MED ORDER — ACETAMINOPHEN 500 MG PO TABS
1000.0000 mg | ORAL_TABLET | Freq: Once | ORAL | Status: AC
  Administered 2022-02-11: 1000 mg via ORAL
  Filled 2022-02-11: qty 2

## 2022-02-11 MED ORDER — KETOROLAC TROMETHAMINE 15 MG/ML IJ SOLN
15.0000 mg | Freq: Once | INTRAMUSCULAR | Status: AC
  Administered 2022-02-11: 15 mg via INTRAMUSCULAR
  Filled 2022-02-11: qty 1

## 2022-02-11 NOTE — ED Triage Notes (Signed)
Patient coming to ED for evaluation of headache x 2 weeks.  Reports pain is located in back of head.  Pain worse with "brushing hair or doing anything."  No reports of blurred vision.  No nausea or vomiting.

## 2022-02-11 NOTE — ED Provider Notes (Signed)
[facility] DEPT Provider Note   CSN: [id number] Arrival date & time: 02/11/22  3235     History  Chief Complaint  Patient presents with   Headache    Deborah Chambers is a 37 y.o. female.  37 year old female with prior medical history as detailed below presents for evaluation.  Patient complains of mild posterior scalp pain times at least 2 weeks.  Patient reports that this pain is intermittent.  She does report that the pain is worse when she combs her hair.  She denies associated photophobia, phonophobia, nausea, vomiting, weakness, vision change.  She reports that she recently completed a course of antibiotics to treat a pneumonia.  She denies current shortness of breath, cough, other respiratory symptoms.  She reports that with her headache she will take Tylenol and this makes her feel better.  The history is provided by the patient and medical records.       Home Medications Prior to Admission medications   Not on File      Allergies    Patient has no known allergies.    Review of Systems   Review of Systems  All other systems reviewed and are negative.   Physical Exam Updated Vital Signs BP (!) 151/97 (BP Location: Left Arm)   Pulse 72   Temp 97.6 F (36.4 C) (Oral)   Resp 20   LMP 02/11/2022   SpO2 100%  Physical Exam Vitals and nursing note reviewed.  Constitutional:      General: She is not in acute distress.    Appearance: Normal appearance. She is well-developed.  HENT:     Head: Normocephalic and atraumatic.  Eyes:     General: No visual field deficit.    Conjunctiva/sclera: Conjunctivae normal.     Pupils: Pupils are equal, round, and reactive to light.  Cardiovascular:     Rate and Rhythm: Normal rate and regular rhythm.     Heart sounds: Normal heart sounds.  Pulmonary:     Effort: Pulmonary effort is normal. No respiratory distress.     Breath sounds: Normal breath sounds.  Abdominal:     General: There is no  distension.     Palpations: Abdomen is soft.     Tenderness: There is no abdominal tenderness.  Musculoskeletal:        General: No deformity. Normal range of motion.     Cervical back: Normal range of motion and neck supple.  Skin:    General: Skin is warm and dry.  Neurological:     General: No focal deficit present.     Mental Status: She is alert and oriented to person, place, and time.     GCS: GCS eye subscore is 4. GCS verbal subscore is 5. GCS motor subscore is 6.     Cranial Nerves: No cranial nerve deficit, dysarthria or facial asymmetry.     Sensory: No sensory deficit.     Motor: No weakness.     Coordination: Romberg sign negative. Coordination normal.     ED Results / Procedures / Treatments   Labs (all labs ordered are listed, but only abnormal results are displayed) Labs Reviewed - No data to display  EKG None  Radiology CT Head Wo Contrast  Result Date: 02/11/2022 CLINICAL DATA:  37 year old female with cough and headache. Recently diagnosed with pneumonia and finished antibiotics although continued symptoms. EXAM: CT HEAD WITHOUT CONTRAST TECHNIQUE: Contiguous axial images were obtained from the base of the skull through the vertex  without intravenous contrast. RADIATION DOSE REDUCTION: This exam was performed according to the departmental dose-optimization program which includes automated exposure control, adjustment of the mA and/or kV according to patient size and/or use of iterative reconstruction technique. COMPARISON:  [facility] and cervical spine CT 11/28/2017. FINDINGS: Brain: Cerebral volume is stable and within normal limits. No midline shift, ventriculomegaly, mass effect, evidence of mass lesion, intracranial hemorrhage or evidence of cortically based acute infarction. Gray-white matter differentiation is within normal limits throughout the brain. Chronic partially empty sella. Vascular: No suspicious intracranial vascular  hyperdensity. Skull: Negative. Sinuses/Orbits: Scattered mild to moderate paranasal sinus mucosal thickening is new since May 25, 2017, most pronounced in the left maxillary sinus with bubbly opacity there. Tympanic cavities and mastoids appear to remain clear. Other: Visualized orbits and scalp soft tissues are within normal limits. IMPRESSION: 1. Chronic partially empty sella, can be a normal anatomic variant or be associated with idiopathic intracranial hypertension (pseudotumor cerebri). 2. Otherwise normal noncontrast CT appearance of the brain. 3. Mild to moderate paranasal sinus inflammation. Consider acute sinusitis in the appropriate clinical setting. Electronically Signed   By: Genevie Ann M.D.   On: 02/11/2022 07:31   DG Chest 2 View  Result Date: 02/11/2022 CLINICAL DATA:  37 year old female with cough and headache. Recently diagnosed with pneumonia and finished antibiotics although continued symptoms. EXAM: CHEST - 2 VIEW COMPARISON:  CTA chest 02/04/2022 and earlier. FINDINGS: PA and lateral views at 0711 hours. Large body habitus. Low normal lung volumes. Normal cardiac size and mediastinal contours. Visualized tracheal air column is within normal limits. Lung markings are within normal limits. No consolidation, pleural effusion, or confluent opacity. No pneumothorax. No osseous abnormality identified. Paucity of bowel gas the visible abdomen. IMPRESSION: No acute cardiopulmonary abnormality. Electronically Signed   By: Genevie Ann M.D.   On: 02/11/2022 07:19    Procedures Procedures    Medications Ordered in ED Medications  acetaminophen (TYLENOL) tablet 1,000 mg (has no administration in time range)  ketorolac (TORADOL) 15 MG/ML injection 15 mg (has no administration in time range)    ED Course/ Medical Decision Making/ A&P                           Medical Decision Making Risk OTC drugs. Prescription drug management.    Medical Screen Complete  This patient presented to the ED with  complaint of headache.  This complaint involves an extensive number of treatment options. The initial differential diagnosis includes, but is not limited to, tension headache, migraine headache, etc.  This presentation is: Acute, Chronic, Self-Limited, Previously Undiagnosed, Uncertain Prognosis, Complicated, Systemic Symptoms, and Threat to Life/Bodily Function  Patient is presenting with complaint of headache.  Describe symptoms are not consistent with likely significant acute pathology.  Specifically patient is without neurologic abnormality, fever, or other concerning reported symptoms.  Exam is within normal limits and no apparent neurologic or other abnormal findings found.  Patient does feel improved after administration of Tylenol and Toradol.  CT imaging of the head is without acute abnormality.  Plain film of chest is without acute abnormality.  She would benefit from close outpatient follow-up.  Strict return precautions given and understood.    Additional history obtained: External records from outside sources obtained and reviewed including prior ED visits and prior Inpatient records.  Imaging Studies ordered:  I ordered imaging studies including CT head, plain, chest I independently visualized and interpreted obtained imaging which showed  NAD I agree with the radiologist interpretation.   Medicines ordered:  I ordered medication including Tylenol, Toradol for headache Reevaluation of the patient after these medicines showed that the patient: improved   Problem List / ED Course:  Headache   Reevaluation:  After the interventions noted above, I reevaluated the patient and found that they have: improved  Disposition:  After consideration of the diagnostic results and the patients response to treatment, I feel that the patent would benefit from close outpatient follow-up.          Final Clinical Impression(s) / ED Diagnoses Final diagnoses:  Acute  intractable headache, unspecified headache type    Rx / DC Orders ED Discharge Orders     None         Wynetta Fines, MD 02/11/22 [phone]

## 2022-02-11 NOTE — Discharge Instructions (Signed)
Return for any problem.  ?

## 2022-02-11 NOTE — ED Provider Triage Note (Signed)
Emergency Medicine Provider Triage Evaluation Note  Deborah Chambers  , a 37 y.o. female  was evaluated in triage.  Pt complains of headache x 2 weeks.  It is in the back of her head, it is constant.  No photophobia, phonophobia, nausea, vomiting, lateralized weakness, vision changes.  Also has a cough, recently diagnosed with pneumonia.  Feels like she still has it although she finished antibiotics..  Review of Systems  Per HPI  Physical Exam  BP (!) 164/110 (BP Location: Left Arm)   Pulse 95   Temp 97.9 F (36.6 C) (Oral)   Resp 20   SpO2 99%  Gen:   Awake, no distress   Resp:  Normal effort  MSK:   Moves extremities without difficulty  Other:  Nerves II to XII gross intact right upper and lower extremity strength symmetric bilaterally.  Ambulatory steady gait.  Medical Decision Making  Medically screening exam initiated at 6:50 AM.  Appropriate orders placed.  Deborah Chambers was informed that the remainder of the evaluation will be completed by another provider, this initial triage assessment does not replace that evaluation, and the importance of remaining in the ED until their evaluation is complete.  Labs, CT.  Will get chest x-ray to ensure resolution of pneumonia.   Sherrill Raring, [place] 02/11/22 [phone]

## 2022-09-17 IMAGING — US US PELVIS COMPLETE
1 series · 15 of 25 positions shown · non-contrast
Comparison: CT from earlier the same day.

CLINICAL DATA: Initial evaluation for acute pelvic pain.

EXAM:
TRANSABDOMINAL AND TRANSVAGINAL ULTRASOUND OF PELVIS
TECHNIQUE: Both transabdominal and transvaginal ultrasound examinations of the
pelvis were performed. Transabdominal technique was performed for
global imaging of the pelvis including uterus, ovaries, adnexal
regions, and pelvic cul-de-sac. It was necessary to proceed with
endovaginal exam following the transabdominal exam to visualize the
endometrium and ovaries.

[Series 1: us pelvis complete mc & wl · 15 of 55 slices shown]
[im 1/55]
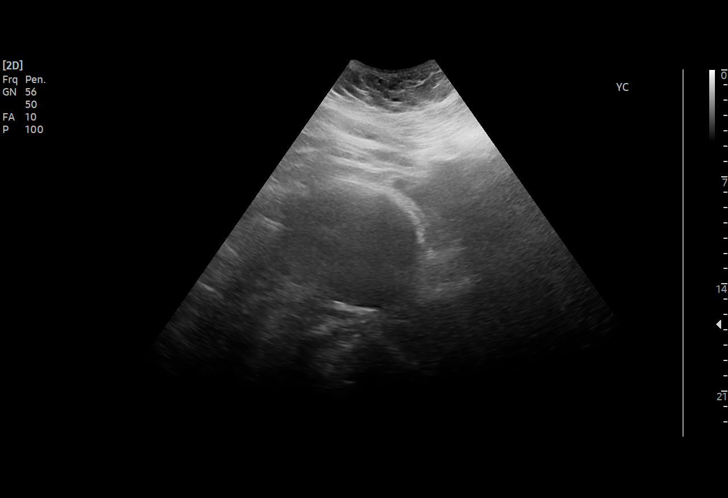
[im 5/55]
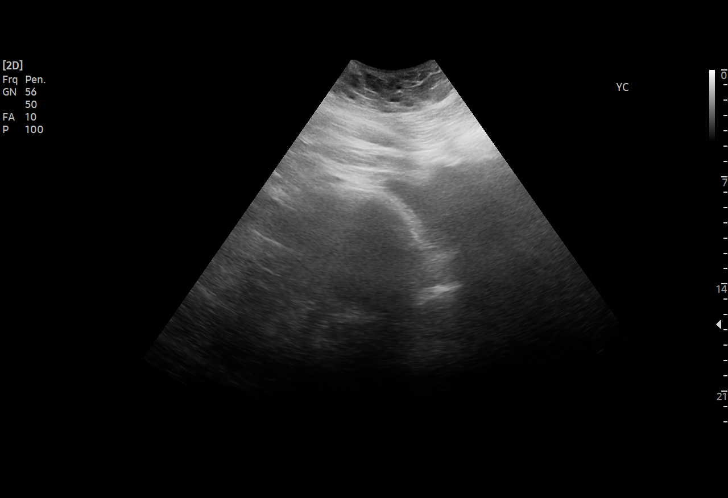
[im 10/55]
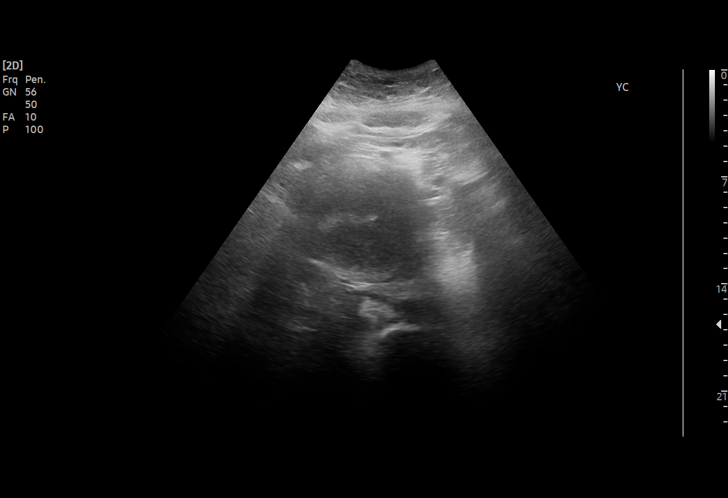
[im 12/55]
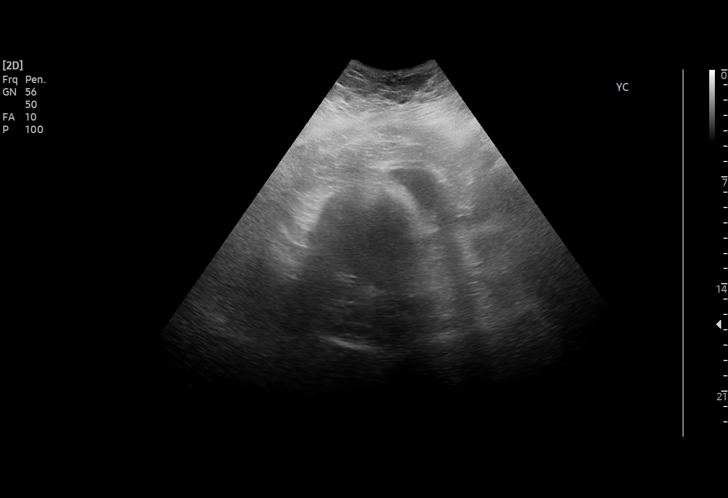
[im 16/55]
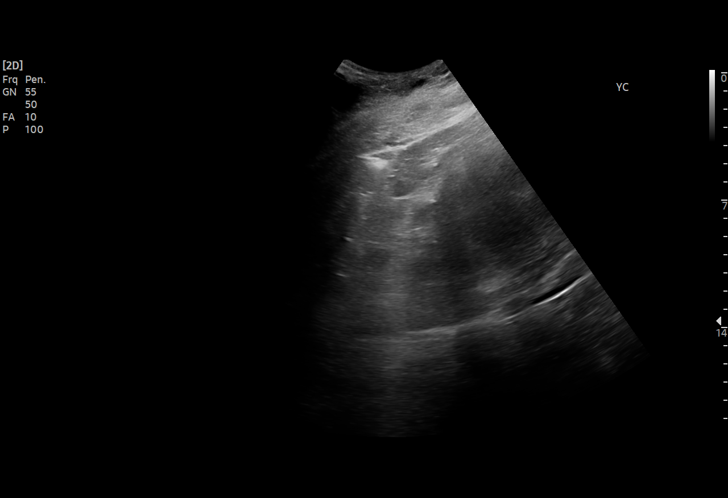
[im 21/55]
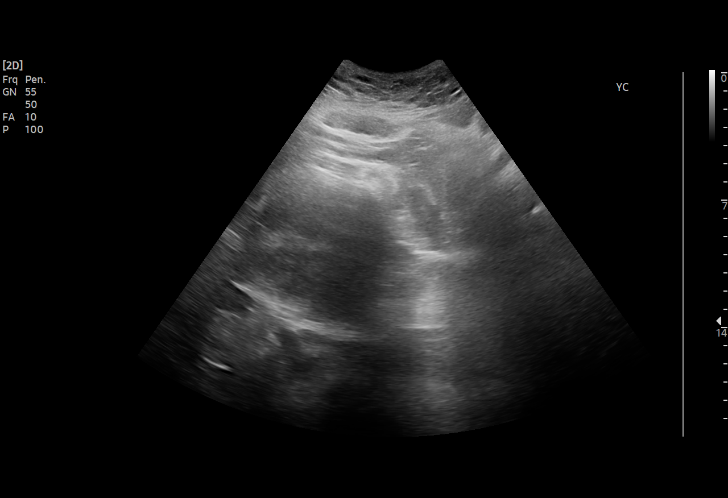
[im 23/55]
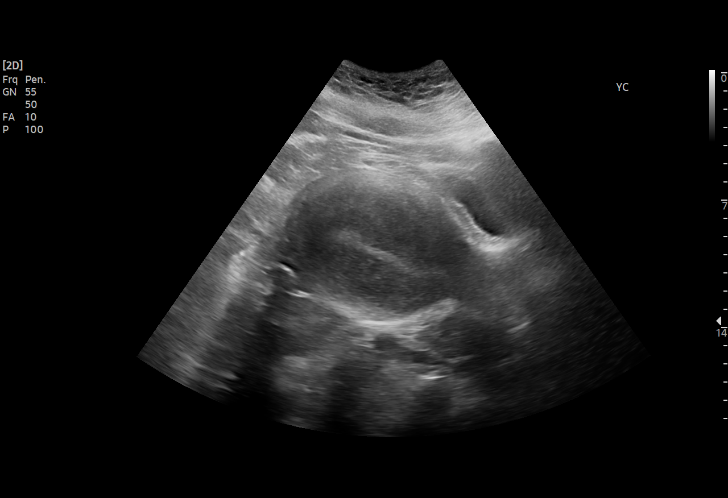
[im 28/55]
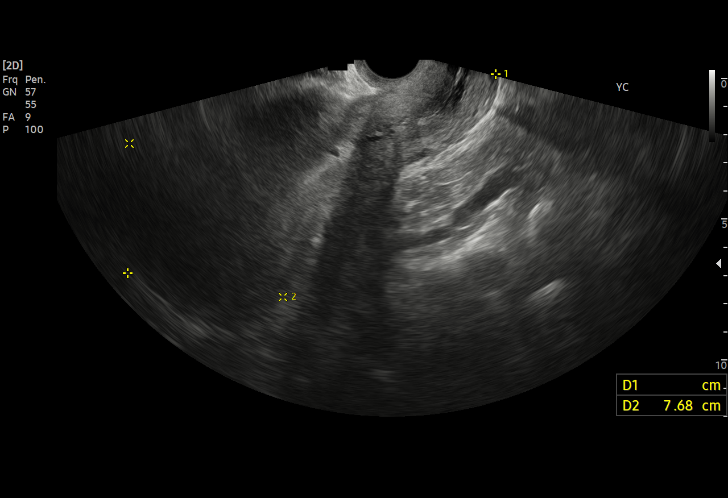
[im 32/55]
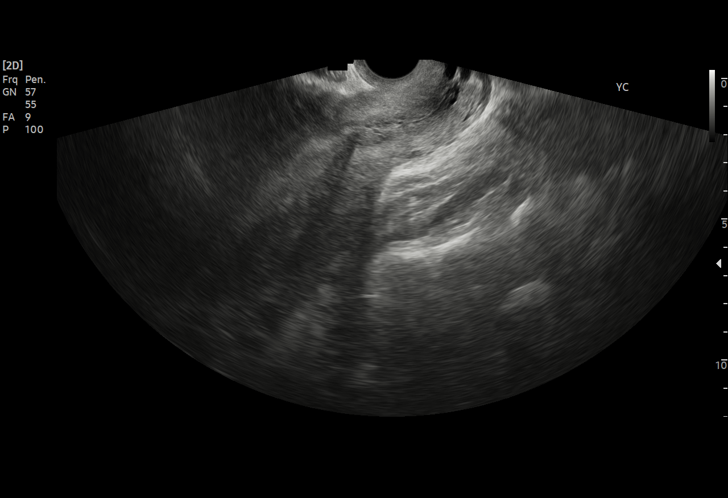
[im 34/55]
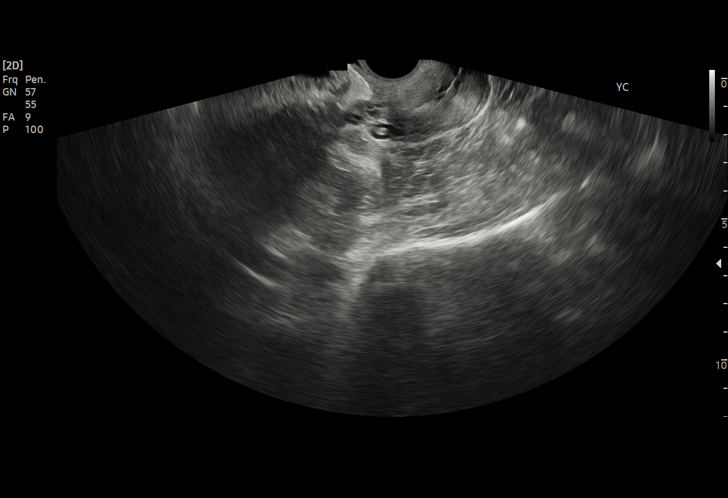
[im 39/55]
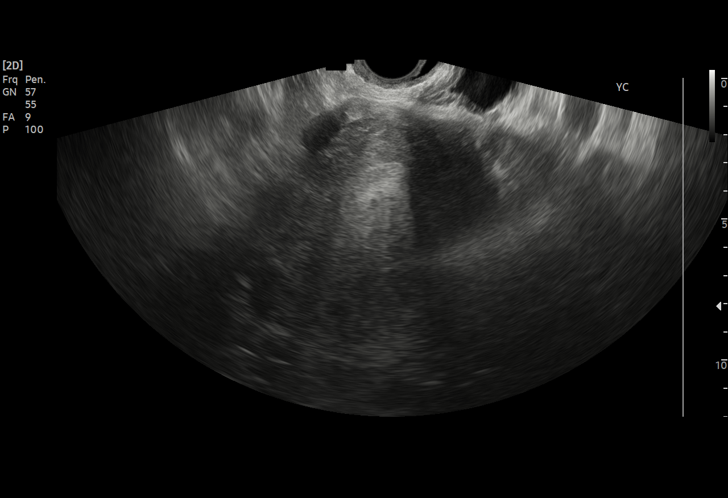
[im 43/55]
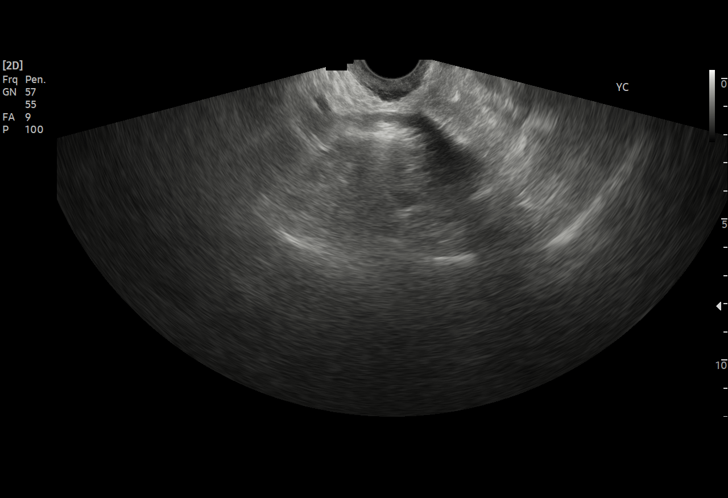
[im 46/55]
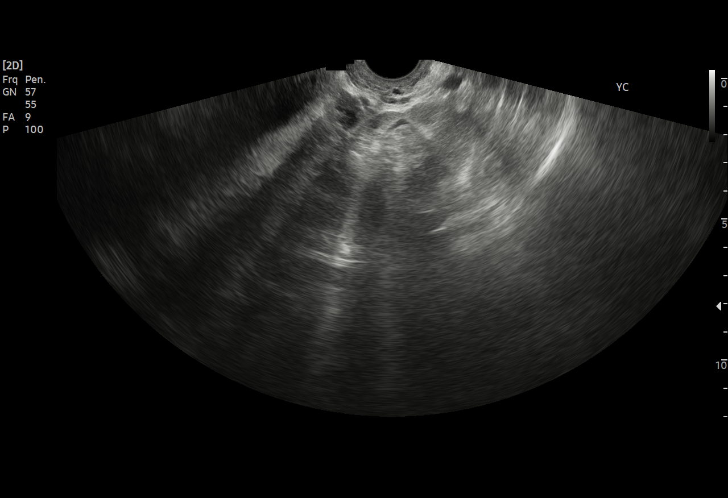
[im 50/55]
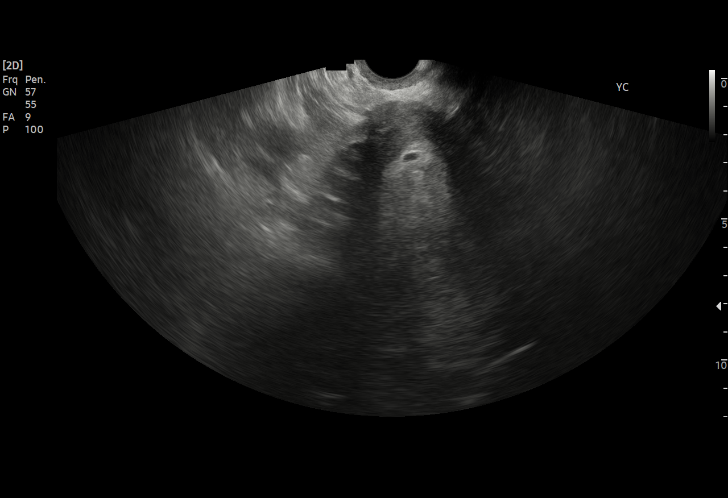
[im 55/55]
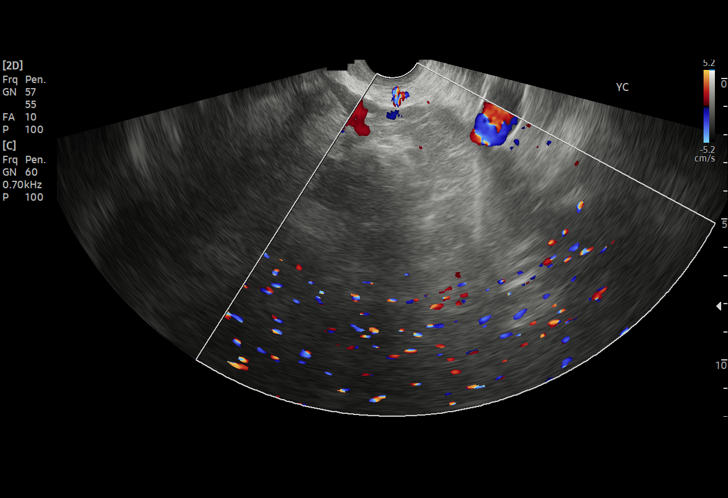

[15 of 25 positions shown; findings below may reference images not displayed]

FINDINGS: Uterus

Measurements: 14.8 x 7.4 x 9.2 cm = volume: 519.3 mL. Uterus is
diffusely enlarged with somewhat globular contour or and bulky
myometrial thickening at the fundus, which could reflect underlying
adenomyosis. No discrete fibroid or other myometrial abnormality.

Endometrium

Thickness: 8.5 mm.  No focal abnormality visualized.

Right ovary

Not visualized.  No adnexal mass.

Left ovary

Not visualized.  No adnexal mass.

Other findings

No abnormal free fluid.
IMPRESSION: 1. Enlarged uterus with somewhat globular contour, which could
reflect underlying adenomyosis. No discrete fibroid or other
myometrial abnormality.
2. Normal endometrium measuring 8.5 mm in thickness.
3. Nonvisualization of either ovary. No adnexal mass or free fluid.
No other acute abnormality.

## 2022-09-17 IMAGING — CT CT ABD-PELV W/ CM
2 of 4 series · 16 of 46 positions shown, 18 images · IV contrast (agent unspecified)
Comparison: CT abdomen and pelvis 01/18/2018.

CLINICAL DATA: Right lower quadrant abdominal pain.

EXAM:
CT ABDOMEN AND PELVIS WITH CONTRAST
TECHNIQUE: Multidetector CT imaging of the abdomen and pelvis was performed
using the standard protocol following bolus administration of
intravenous contrast.

[Series 2: axial st · axial · 0.97mm/px · z∈[-530,-75]mm · 13 of 105 slices shown, 15 images]
[im 7/105  soft-tissue]
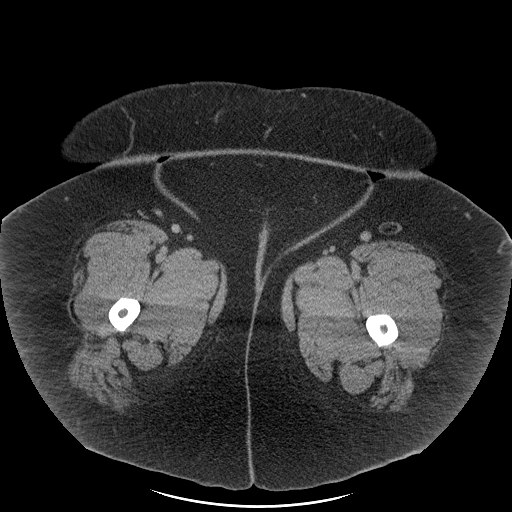
[im 7/105  bone]
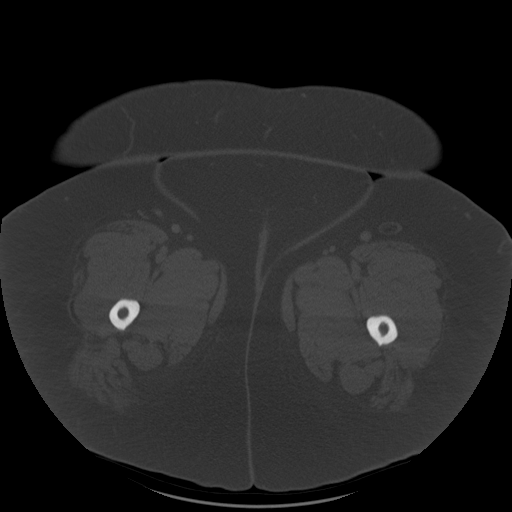
[im 14/105  soft-tissue]
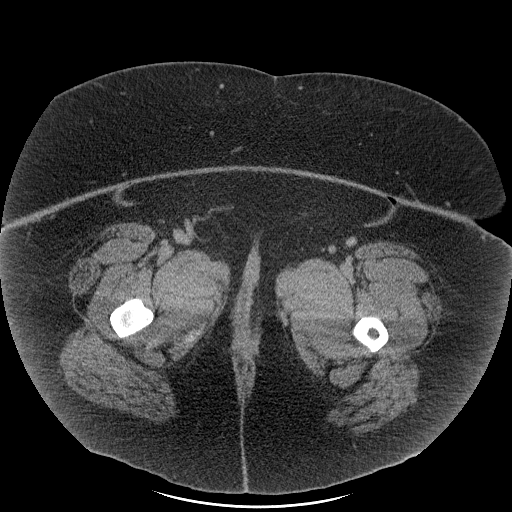
[im 20/105  soft-tissue]
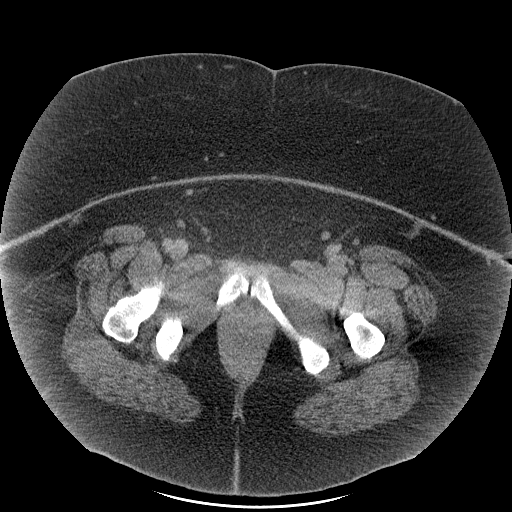
[im 33/105  soft-tissue]
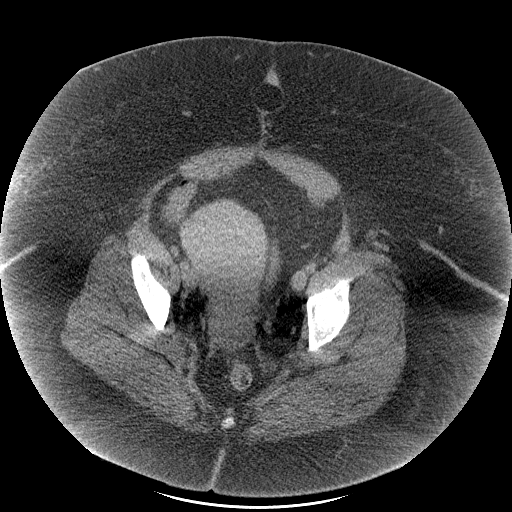
[im 40/105  soft-tissue]
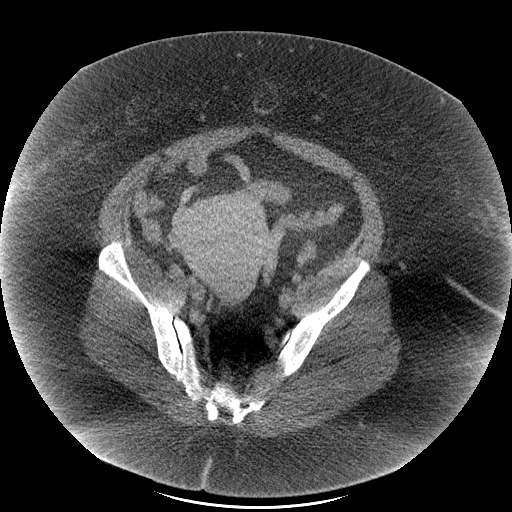
[im 46/105  soft-tissue]
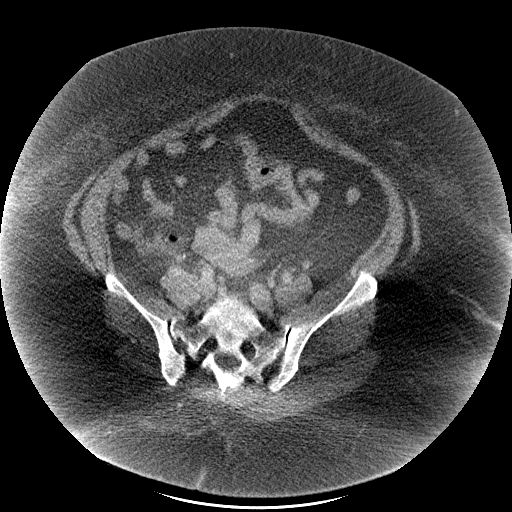
[im 53/105  soft-tissue]
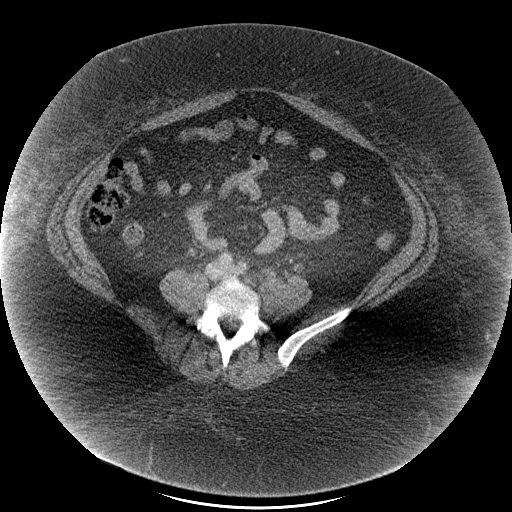
[im 59/105  soft-tissue]
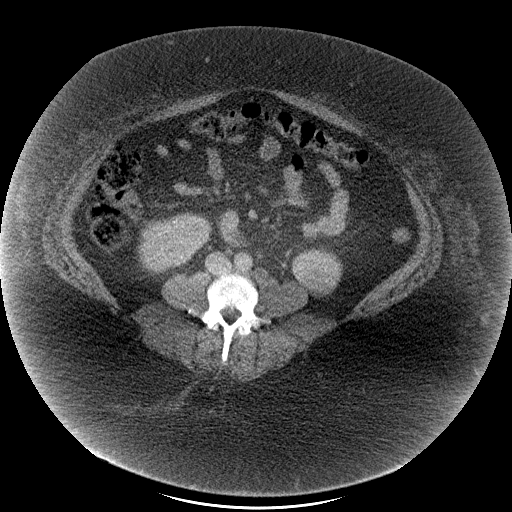
[im 66/105  soft-tissue]
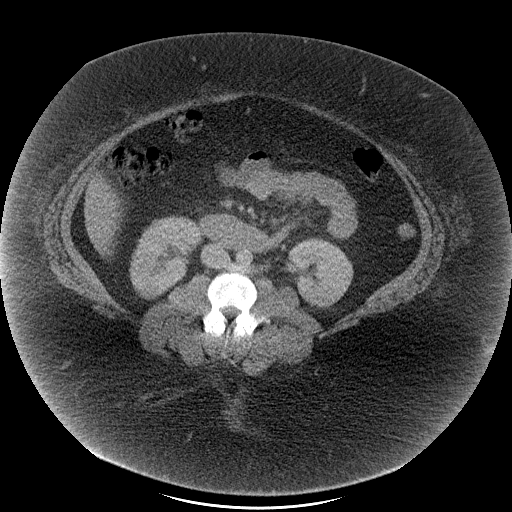
[im 66/105  bone]
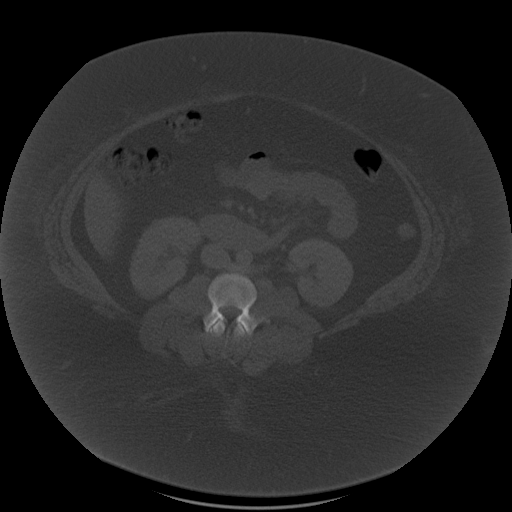
[im 72/105  soft-tissue]
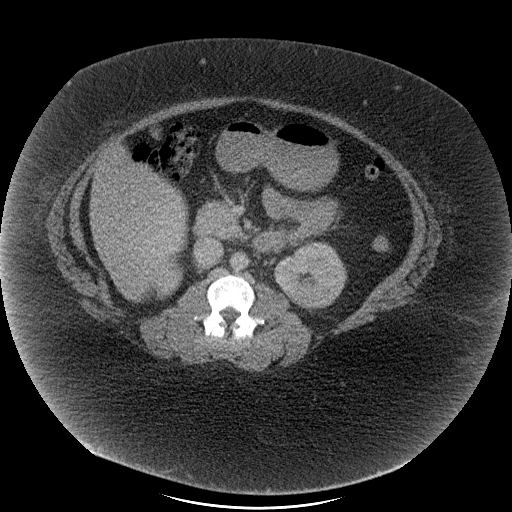
[im 85/105  soft-tissue]
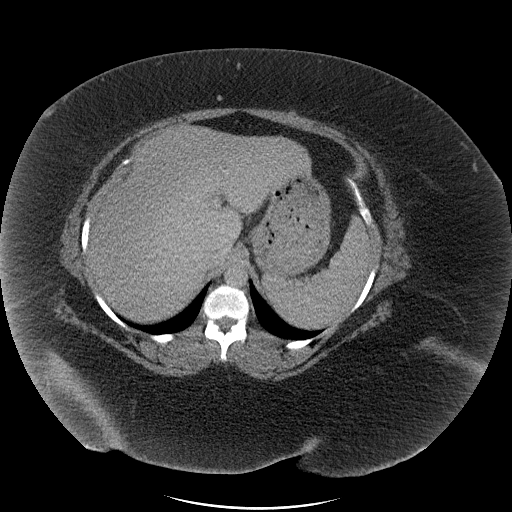
[im 92/105  soft-tissue]
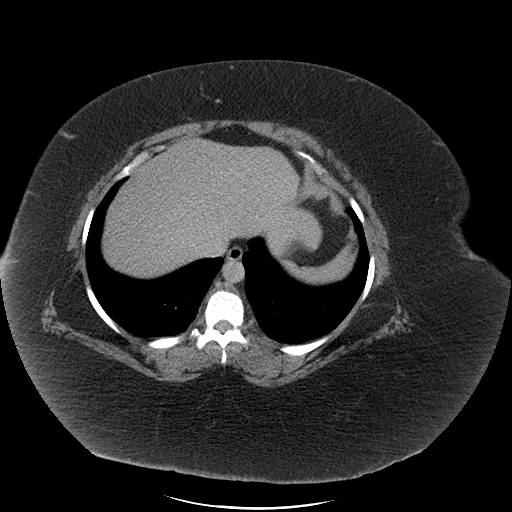
[im 98/105  soft-tissue]
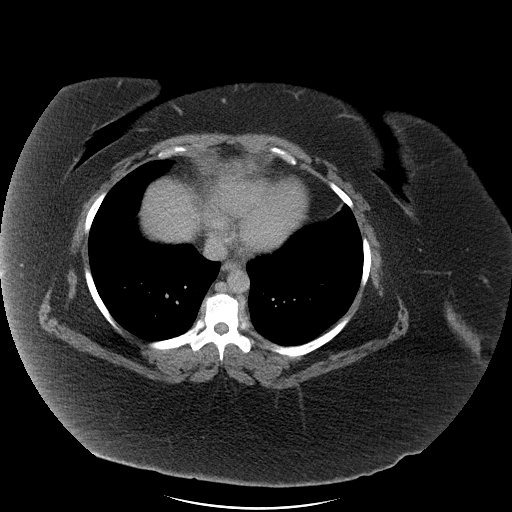

[Series 4: coronal st · coronal · 0.99mm/px · 3 of 205 slices shown]
[im 69/205  soft-tissue]
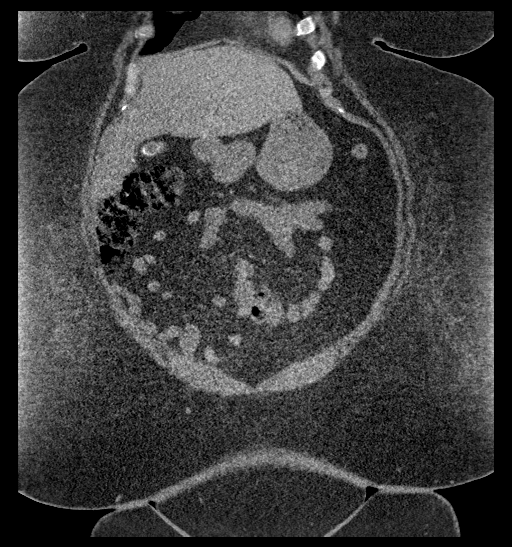
[im 91/205  soft-tissue]
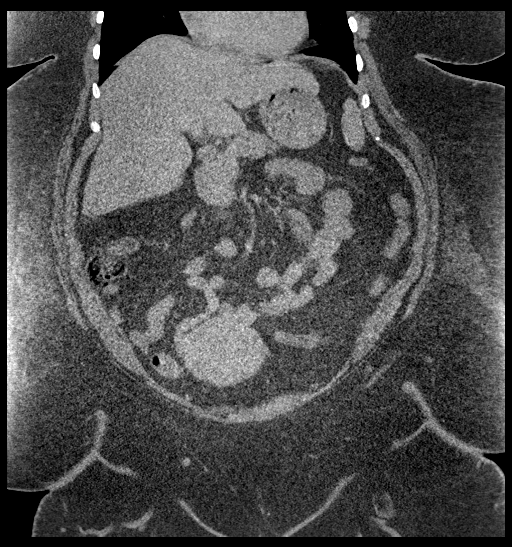
[im 114/205  soft-tissue]
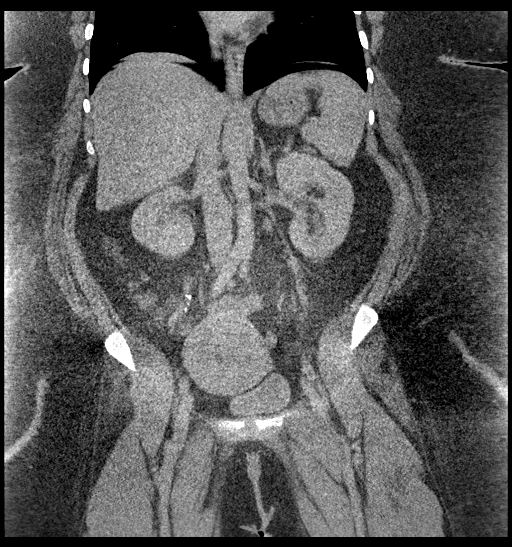

[16 of 46 positions shown; findings below may reference images not displayed]

RADIATION DOSE REDUCTION: This exam was performed according to the
departmental dose-optimization program which includes automated
exposure control, adjustment of the mA and/or kV according to
patient size and/or use of iterative reconstruction technique.

CONTRAST:  100mL OMNIPAQUE IOHEXOL 300 MG/ML  SOLN
FINDINGS: Lower chest: No acute abnormality.

Hepatobiliary: Multiple gallstones are again seen. There is no
pericholecystic inflammation or biliary ductal dilatation. The liver
is within normal limits.

Pancreas: Unremarkable. No pancreatic ductal dilatation or
surrounding inflammatory changes.

Spleen: Normal in size without focal abnormality.

Adrenals/Urinary Tract: Adrenal glands are unremarkable. Kidneys are
normal, without renal calculi, focal lesion, or hydronephrosis.
Bladder is unremarkable.

Stomach/Bowel: Stomach is within normal limits. No evidence of bowel
wall thickening, distention, or inflammatory changes. The appendix
is not visualized.

Vascular/Lymphatic: No significant vascular findings are present. No
enlarged abdominal or pelvic lymph nodes.

Reproductive: Uterus is prominent size, unchanged. Ovaries are
unremarkable.

Other: Small fat containing umbilical hernia is unchanged. No
ascites or free air.

Musculoskeletal: No acute or significant osseous findings.
IMPRESSION: 1.  No acute localizing process in the abdomen or pelvis.

2.  Stable prominent uterus, likely related to fibroid change.

3.  Stable small fat containing umbilical hernia.

4.  Stable cholelithiasis.
